# Patient Record
Sex: Female | Born: 1960 | Race: White | Hispanic: No | Smoking: Former smoker
Health system: Southern US, Community
[De-identification: ages and names within clinical notes are randomized; demographics above are authoritative.]

## PROBLEM LIST (undated history)

## (undated) DIAGNOSIS — F410 Panic disorder [episodic paroxysmal anxiety] without agoraphobia: Secondary | ICD-10-CM

## (undated) DIAGNOSIS — I639 Cerebral infarction, unspecified: Secondary | ICD-10-CM

## (undated) DIAGNOSIS — I10 Essential (primary) hypertension: Secondary | ICD-10-CM

## (undated) DIAGNOSIS — F419 Anxiety disorder, unspecified: Secondary | ICD-10-CM

## (undated) DIAGNOSIS — Z9862 Peripheral vascular angioplasty status: Secondary | ICD-10-CM

## (undated) DIAGNOSIS — J449 Chronic obstructive pulmonary disease, unspecified: Secondary | ICD-10-CM

## (undated) HISTORY — PX: TUBAL LIGATION: SHX77

## (undated) HISTORY — PX: CHOLECYSTECTOMY: SHX55

## (undated) HISTORY — PX: ABDOMINAL HYSTERECTOMY: SHX81

## (undated) HISTORY — PX: MIDDLE EAR SURGERY: SHX713

## (undated) HISTORY — PX: KNEE ARTHROSCOPY: SUR90

---

## 2010-09-02 ENCOUNTER — Emergency Department (HOSPITAL_COMMUNITY)
Admission: EM | Admit: 2010-09-02 | Discharge: 2010-09-02 | Payer: Self-pay | Source: Home / Self Care | Admitting: Emergency Medicine

## 2011-06-09 ENCOUNTER — Emergency Department (HOSPITAL_COMMUNITY): Payer: PRIVATE HEALTH INSURANCE

## 2011-06-09 ENCOUNTER — Emergency Department (HOSPITAL_COMMUNITY)
Admission: EM | Admit: 2011-06-09 | Discharge: 2011-06-09 | Disposition: A | Discharge status: Home / Self Care | Payer: PRIVATE HEALTH INSURANCE | Attending: Emergency Medicine | Admitting: Emergency Medicine

## 2011-06-09 DIAGNOSIS — S92919A Unspecified fracture of unspecified toe(s), initial encounter for closed fracture: Secondary | ICD-10-CM | POA: Insufficient documentation

## 2011-06-09 DIAGNOSIS — Y990 Civilian activity done for income or pay: Secondary | ICD-10-CM | POA: Insufficient documentation

## 2011-06-09 DIAGNOSIS — W2209XA Striking against other stationary object, initial encounter: Secondary | ICD-10-CM | POA: Insufficient documentation

## 2011-06-09 DIAGNOSIS — Y9389 Activity, other specified: Secondary | ICD-10-CM | POA: Insufficient documentation

## 2012-02-05 ENCOUNTER — Emergency Department (HOSPITAL_COMMUNITY): Payer: Medicare Other

## 2012-02-05 ENCOUNTER — Other Ambulatory Visit: Payer: Self-pay

## 2012-02-05 ENCOUNTER — Encounter (HOSPITAL_COMMUNITY): Payer: Self-pay | Admitting: *Deleted

## 2012-02-05 ENCOUNTER — Inpatient Hospital Stay (HOSPITAL_COMMUNITY)
Admission: EM | Admit: 2012-02-05 | Discharge: 2012-02-07 | DRG: 192 | Disposition: A | Discharge status: Home / Self Care | Payer: Medicare Other | Attending: Internal Medicine | Admitting: Internal Medicine

## 2012-02-05 DIAGNOSIS — Z720 Tobacco use: Secondary | ICD-10-CM

## 2012-02-05 DIAGNOSIS — Z9861 Coronary angioplasty status: Secondary | ICD-10-CM

## 2012-02-05 DIAGNOSIS — Z8673 Personal history of transient ischemic attack (TIA), and cerebral infarction without residual deficits: Secondary | ICD-10-CM

## 2012-02-05 DIAGNOSIS — I2510 Atherosclerotic heart disease of native coronary artery without angina pectoris: Secondary | ICD-10-CM | POA: Diagnosis present

## 2012-02-05 DIAGNOSIS — R071 Chest pain on breathing: Secondary | ICD-10-CM | POA: Diagnosis present

## 2012-02-05 DIAGNOSIS — M549 Dorsalgia, unspecified: Secondary | ICD-10-CM | POA: Diagnosis present

## 2012-02-05 DIAGNOSIS — Z23 Encounter for immunization: Secondary | ICD-10-CM

## 2012-02-05 DIAGNOSIS — J441 Chronic obstructive pulmonary disease with (acute) exacerbation: Secondary | ICD-10-CM

## 2012-02-05 DIAGNOSIS — Z7902 Long term (current) use of antithrombotics/antiplatelets: Secondary | ICD-10-CM

## 2012-02-05 DIAGNOSIS — Z888 Allergy status to other drugs, medicaments and biological substances status: Secondary | ICD-10-CM

## 2012-02-05 DIAGNOSIS — R079 Chest pain, unspecified: Secondary | ICD-10-CM | POA: Diagnosis present

## 2012-02-05 DIAGNOSIS — J440 Chronic obstructive pulmonary disease with acute lower respiratory infection: Principal | ICD-10-CM | POA: Diagnosis present

## 2012-02-05 DIAGNOSIS — G8929 Other chronic pain: Secondary | ICD-10-CM | POA: Diagnosis present

## 2012-02-05 DIAGNOSIS — F411 Generalized anxiety disorder: Secondary | ICD-10-CM | POA: Diagnosis present

## 2012-02-05 DIAGNOSIS — F17200 Nicotine dependence, unspecified, uncomplicated: Secondary | ICD-10-CM | POA: Diagnosis present

## 2012-02-05 DIAGNOSIS — J209 Acute bronchitis, unspecified: Principal | ICD-10-CM | POA: Diagnosis present

## 2012-02-05 DIAGNOSIS — J449 Chronic obstructive pulmonary disease, unspecified: Secondary | ICD-10-CM

## 2012-02-05 DIAGNOSIS — Z79899 Other long term (current) drug therapy: Secondary | ICD-10-CM

## 2012-02-05 HISTORY — DX: Cerebral infarction, unspecified: I63.9

## 2012-02-05 HISTORY — DX: Chronic obstructive pulmonary disease, unspecified: J44.9

## 2012-02-05 HISTORY — DX: Peripheral vascular angioplasty status: Z98.62

## 2012-02-05 LAB — BASIC METABOLIC PANEL
BUN: 22 mg/dL (ref 6–23)
CO2: 25 mEq/L (ref 19–32)
Calcium: 9.8 mg/dL (ref 8.4–10.5)
Creatinine, Ser: 0.8 mg/dL (ref 0.50–1.10)
GFR calc non Af Amer: 85 mL/min — ABNORMAL LOW (ref >90)
Glucose, Bld: 131 mg/dL — ABNORMAL HIGH (ref 70–99)

## 2012-02-05 LAB — CBC
HCT: 40.2 % (ref 36.0–46.0)
Hemoglobin: 13.4 g/dL (ref 12.0–15.0)
MCH: 29.8 pg (ref 26.0–34.0)
MCHC: 33.3 g/dL (ref 30.0–36.0)
MCV: 89.5 fL (ref 78.0–100.0)

## 2012-02-05 LAB — CARDIAC PANEL(CRET KIN+CKTOT+MB+TROPI): Relative Index: INVALID (ref 0.0–2.5)

## 2012-02-05 LAB — D-DIMER, QUANTITATIVE: D-Dimer, Quant: 0.41 ug/mL-FEU (ref 0.00–0.48)

## 2012-02-05 LAB — APTT: aPTT: 30 seconds (ref 24–37)

## 2012-02-05 LAB — PROTIME-INR: Prothrombin Time: 12.5 seconds (ref 11.6–15.2)

## 2012-02-05 MED ORDER — ASPIRIN 81 MG PO CHEW
162.0000 mg | CHEWABLE_TABLET | Freq: Once | ORAL | Status: AC
  Administered 2012-02-05: 162 mg via ORAL
  Filled 2012-02-05: qty 2

## 2012-02-05 MED ORDER — METHYLPREDNISOLONE SODIUM SUCC 125 MG IJ SOLR
125.0000 mg | Freq: Once | INTRAMUSCULAR | Status: AC
  Administered 2012-02-05: 125 mg via INTRAVENOUS
  Filled 2012-02-05: qty 2

## 2012-02-05 MED ORDER — SODIUM CHLORIDE 0.9 % IV BOLUS (SEPSIS)
500.0000 mL | Freq: Once | INTRAVENOUS | Status: AC
  Administered 2012-02-05: 23:00:00 via INTRAVENOUS

## 2012-02-05 MED ORDER — ALBUTEROL SULFATE (5 MG/ML) 0.5% IN NEBU
5.0000 mg | INHALATION_SOLUTION | Freq: Once | RESPIRATORY_TRACT | Status: AC
  Administered 2012-02-05: 5 mg via RESPIRATORY_TRACT
  Filled 2012-02-05: qty 1

## 2012-02-05 NOTE — ED Provider Notes (Signed)
History   Scribed for Wilson Singer, MD, the patient was seen in room A210/A210-01 . This chart was scribed by Lewanda Rife.   CSN: [id number]  Arrival date & time 02/05/12  2107   First MD Initiated Contact with Patient 02/05/12 2145      Chief Complaint  Patient presents with  . Chest Pain    (Consider location/radiation/quality/duration/timing/severity/associated sxs/prior treatment) HPI Comments: Pt denies calf swelling, leg pain and recent travels. Pt reports she smokes everyday and is on 4 Liters of oxygen at home. Pt also uses nebulizer tx at home as needed. Hx of stroke and cardiac catheterization. Pt complains of pleuritic chest pain.  Patient is a 51 y.o. female presenting with chest pain. The history is provided by the patient.  Chest Pain The chest pain began 1 - 2 hours ago. Chest pain occurs constantly. The chest pain is improving. At its most intense, the pain is at 10/10. The pain is currently at 7/10. The severity of the pain is severe. The quality of the pain is described as sharp. The pain radiates to the left arm. Chest pain is worsened by deep breathing. Primary symptoms include shortness of breath (worsening of shortness of breath ), cough (Non-productive), wheezing and nausea (about an hour ago ). Pertinent negatives for primary symptoms include no fatigue, no abdominal pain and no vomiting. She tried aspirin for the symptoms. Risk factors include smoking/tobacco exposure.  Her past medical history is significant for COPD.  Pertinent negatives for past medical history include no seizures.  Procedure history is positive for cardiac catheterization.     Past Medical History  Diagnosis Date  . Stroke   . H/O angioplasty   . COPD (chronic obstructive pulmonary disease)     Past Surgical History  Procedure Date  . Abdominal hysterectomy   . Cholecystectomy   . Tubal ligation   . Knee arthroscopy   . Middle ear surgery     Family History  Problem  Relation Age of Onset  . Heart attack Father   . Heart failure Father   . Hypertension Father   . Heart attack Brother     History  Substance Use Topics  . Smoking status: Current Everyday Smoker -- 0.2 packs/day  . Smokeless tobacco: Not on file  . Alcohol Use: Yes     occ    OB History    Grav Para Term Preterm Abortions TAB SAB Ect Mult Living                  Review of Systems  Constitutional: Negative for fatigue.  HENT: Negative for congestion, sinus pressure and ear discharge.   Eyes: Negative for discharge.  Respiratory: Positive for cough (Non-productive), shortness of breath (worsening of shortness of breath ) and wheezing.   Cardiovascular: Positive for chest pain.  Gastrointestinal: Positive for nausea (about an hour ago ). Negative for vomiting, abdominal pain and diarrhea.  Genitourinary: Negative for frequency and hematuria.  Musculoskeletal: Negative for back pain.  Skin: Negative for rash.  Neurological: Negative for seizures and headaches.  Hematological: Negative.   Psychiatric/Behavioral: Negative for hallucinations.    Allergies  Adhesive and Codeine  Home Medications   No current outpatient prescriptions on file.  BP 132/72  Pulse 84  Temp(Src) 97.4 F (36.3 C) (Oral)  Resp 18  Ht 5\' 4"  (1.626 m)  Wt 148 lb 5 oz (67.274 kg)  BMI 25.46 kg/m2  SpO2 98%  Physical Exam  Nursing note and  vitals reviewed. Constitutional: She is oriented to person, place, and time. She appears well-developed.  HENT:  Head: Normocephalic and atraumatic.  Mouth/Throat: Oropharynx is clear and moist. No oropharyngeal exudate.  Eyes: Conjunctivae and EOM are normal. No scleral icterus.  Neck: Normal range of motion. Neck supple. No thyromegaly present.  Cardiovascular: Normal rate and regular rhythm.  Exam reveals no gallop and no friction rub.   No murmur heard. Pulmonary/Chest: No stridor. She has wheezes (Diffuse expiratory wheezes ). She has no rales. She  exhibits no tenderness.  Abdominal: Soft. She exhibits no distension. There is no tenderness. There is no rebound.  Musculoskeletal: Normal range of motion. She exhibits no edema and no tenderness (No calf tenderness ).  Lymphadenopathy:    She has no cervical adenopathy.  Neurological: She is alert and oriented to person, place, and time. Coordination normal.  Skin: No rash noted. No erythema.  Psychiatric: She has a normal mood and affect. Her behavior is normal.    ED Course  Procedures (including critical care time)  Results for orders placed during the hospital encounter of 02/05/12  CBC      Component Value Range   WBC 6.5  4.0 - 10.5 (K/uL)   RBC 4.49  3.87 - 5.11 (MIL/uL)   Hemoglobin 13.4  12.0 - 15.0 (g/dL)   HCT 16.1  09.6 - 04.5 (%)   MCV 89.5  78.0 - 100.0 (fL)   MCH 29.8  26.0 - 34.0 (pg)   MCHC 33.3  30.0 - 36.0 (g/dL)   RDW 40.9  81.1 - 91.4 (%)   Platelets 213  150 - 400 (K/uL)  BASIC METABOLIC PANEL      Component Value Range   Sodium 139  135 - 145 (mEq/L)   Potassium 3.7  3.5 - 5.1 (mEq/L)   Chloride 103  96 - 112 (mEq/L)   CO2 25  19 - 32 (mEq/L)   Glucose, Bld 131 (*) 70 - 99 (mg/dL)   BUN 22  6 - 23 (mg/dL)   Creatinine, Ser 7.82  0.50 - 1.10 (mg/dL)   Calcium 9.8  8.4 - 95.6 (mg/dL)   GFR calc non Af Amer 85 (*) >90 (mL/min)   GFR calc Af Amer >90  >90 (mL/min)  PRO B NATRIURETIC PEPTIDE      Component Value Range   Pro B Natriuretic peptide (BNP) 103.2  0 - 125 (pg/mL)  CARDIAC PANEL(CRET KIN+CKTOT+MB+TROPI)      Component Value Range   Total CK 98  7 - 177 (U/L)   CK, MB 2.5  0.3 - 4.0 (ng/mL)   Troponin I <0.30  <0.30 (ng/mL)   Relative Index RELATIVE INDEX IS INVALID  0.0 - 2.5   D-DIMER, QUANTITATIVE      Component Value Range   D-Dimer, Quant 0.41  0.00 - 0.48 (ug/mL-FEU)  PROTIME-INR      Component Value Range   Prothrombin Time 12.5  11.6 - 15.2 (seconds)   INR 0.91  0.00 - 1.49   APTT      Component Value Range   aPTT 30  24 - 37  (seconds)  CARDIAC PANEL(CRET KIN+CKTOT+MB+TROPI)      Component Value Range   Total CK 79  7 - 177 (U/L)   CK, MB 2.3  0.3 - 4.0 (ng/mL)   Troponin I <0.30  <0.30 (ng/mL)   Relative Index RELATIVE INDEX IS INVALID  0.0 - 2.5   CARDIAC PANEL(CRET KIN+CKTOT+MB+TROPI)      Component  Value Range   Total CK 69  7 - 177 (U/L)   CK, MB 2.2  0.3 - 4.0 (ng/mL)   Troponin I <0.30  <0.30 (ng/mL)   Relative Index RELATIVE INDEX IS INVALID  0.0 - 2.5   TSH      Component Value Range   TSH 0.333 (*) 0.350 - 4.500 (uIU/mL)  COMPREHENSIVE METABOLIC PANEL      Component Value Range   Sodium 139  135 - 145 (mEq/L)   Potassium 3.7  3.5 - 5.1 (mEq/L)   Chloride 106  96 - 112 (mEq/L)   CO2 22  19 - 32 (mEq/L)   Glucose, Bld 158 (*) 70 - 99 (mg/dL)   BUN 20  6 - 23 (mg/dL)   Creatinine, Ser 1.19  0.50 - 1.10 (mg/dL)   Calcium 9.9  8.4 - 14.7 (mg/dL)   Total Protein 7.0  6.0 - 8.3 (g/dL)   Albumin 3.8  3.5 - 5.2 (g/dL)   AST 17  0 - 37 (U/L)   ALT 14  0 - 35 (U/L)   Alkaline Phosphatase 112  39 - 117 (U/L)   Total Bilirubin 0.1 (*) 0.3 - 1.2 (mg/dL)   GFR calc non Af Amer >90  >90 (mL/min)   GFR calc Af Amer >90  >90 (mL/min)  CBC      Component Value Range   WBC 8.0  4.0 - 10.5 (K/uL)   RBC 4.40  3.87 - 5.11 (MIL/uL)   Hemoglobin 13.3  12.0 - 15.0 (g/dL)   HCT 82.9  56.2 - 13.0 (%)   MCV 89.3  78.0 - 100.0 (fL)   MCH 30.2  26.0 - 34.0 (pg)   MCHC 33.8  30.0 - 36.0 (g/dL)   RDW 86.5  78.4 - 69.6 (%)   Platelets 201  150 - 400 (K/uL)  LIPID PANEL      Component Value Range   Cholesterol 156  0 - 200 (mg/dL)   Triglycerides 66  <295 (mg/dL)   HDL 61  >28 (mg/dL)   Total CHOL/HDL Ratio 2.6     VLDL 13  0 - 40 (mg/dL)   LDL Cholesterol 82  0 - 99 (mg/dL)     Chest Portable 1 View  02/05/2012  *RADIOLOGY REPORT*  Clinical Data: 51 year old female with chest pain.  PORTABLE CHEST - 1 VIEW  Comparison: None.  Findings: AP portable upright view 2130 hours.  Lung volumes within normal  limits.  Cardiac size and mediastinal contours are within normal limits.  Visualized tracheal air column is within normal limits.  No pneumothorax and allowing for portable technique, the lungs are clear.  Right upper quadrant surgical clips.  IMPRESSION: No acute cardiopulmonary abnormality.  Original Report Authenticated By: Ulla Potash III, M.D.     1. Chest pain   2. COPD exacerbation   3. COPD (chronic obstructive pulmonary disease)   4. History of stroke   5. Tobacco abuse      Date: 02/05/2012  Rate:79  Rhythm: normal sinus rhythm  QRS Axis: normal  Intervals: normal  ST/T Wave abnormalities: nonspecific T wave changes  Conduction Disutrbances:none  Narrative Interpretation:   Old EKG Reviewed: none available    MDM        I personally performed the services described in this documentation, which was scribed in my presence. The recorded information has been reviewed and considered.     Loren Racer, MD 02/06/12 [phone]

## 2012-02-05 NOTE — ED Notes (Signed)
Chest pain for 1 hour , cough today.

## 2012-02-06 ENCOUNTER — Encounter (HOSPITAL_COMMUNITY): Payer: Self-pay | Admitting: Internal Medicine

## 2012-02-06 ENCOUNTER — Other Ambulatory Visit: Payer: Self-pay

## 2012-02-06 DIAGNOSIS — Z720 Tobacco use: Secondary | ICD-10-CM | POA: Diagnosis present

## 2012-02-06 DIAGNOSIS — M549 Dorsalgia, unspecified: Secondary | ICD-10-CM | POA: Diagnosis present

## 2012-02-06 DIAGNOSIS — R072 Precordial pain: Secondary | ICD-10-CM

## 2012-02-06 DIAGNOSIS — Z8673 Personal history of transient ischemic attack (TIA), and cerebral infarction without residual deficits: Secondary | ICD-10-CM

## 2012-02-06 DIAGNOSIS — R079 Chest pain, unspecified: Secondary | ICD-10-CM | POA: Diagnosis present

## 2012-02-06 DIAGNOSIS — J449 Chronic obstructive pulmonary disease, unspecified: Secondary | ICD-10-CM

## 2012-02-06 LAB — CBC
Platelets: 201 10*3/uL (ref 150–400)
RBC: 4.4 MIL/uL (ref 3.87–5.11)
WBC: 8 10*3/uL (ref 4.0–10.5)

## 2012-02-06 LAB — CARDIAC PANEL(CRET KIN+CKTOT+MB+TROPI)
Relative Index: INVALID (ref 0.0–2.5)
Relative Index: INVALID (ref 0.0–2.5)
Total CK: 79 U/L (ref 7–177)
Total CK: 69 U/L (ref 7–177)
Total CK: 55 U/L (ref 7–177)

## 2012-02-06 LAB — COMPREHENSIVE METABOLIC PANEL
BUN: 20 mg/dL (ref 6–23)
CO2: 22 mEq/L (ref 19–32)
Chloride: 106 mEq/L (ref 96–112)
Creatinine, Ser: 0.74 mg/dL (ref 0.50–1.10)
GFR calc non Af Amer: >90 mL/min (ref >90)
Total Bilirubin: 0.1 mg/dL — ABNORMAL LOW (ref 0.3–1.2)

## 2012-02-06 LAB — LIPID PANEL
Cholesterol: 156 mg/dL (ref 0–200)
HDL: 61 mg/dL (ref >39)
Total CHOL/HDL Ratio: 2.6 RATIO

## 2012-02-06 MED ORDER — ALBUTEROL SULFATE (5 MG/ML) 0.5% IN NEBU
2.5000 mg | INHALATION_SOLUTION | RESPIRATORY_TRACT | Status: DC | PRN
  Administered 2012-02-06: 2.5 mg via RESPIRATORY_TRACT
  Filled 2012-02-06: qty 0.5

## 2012-02-06 MED ORDER — ONDANSETRON HCL 4 MG PO TABS
4.0000 mg | ORAL_TABLET | Freq: Four times a day (QID) | ORAL | Status: DC | PRN
  Administered 2012-02-06 – 2012-02-07 (×2): 4 mg via ORAL
  Filled 2012-02-06 (×2): qty 1

## 2012-02-06 MED ORDER — PNEUMOCOCCAL VAC POLYVALENT 25 MCG/0.5ML IJ INJ
0.5000 mL | INJECTION | Freq: Once | INTRAMUSCULAR | Status: AC
  Administered 2012-02-06: 0.5 mL via INTRAMUSCULAR
  Filled 2012-02-06: qty 0.5

## 2012-02-06 MED ORDER — ZOLPIDEM TARTRATE 5 MG PO TABS
10.0000 mg | ORAL_TABLET | Freq: Every evening | ORAL | Status: DC | PRN
  Administered 2012-02-06: 10 mg via ORAL
  Filled 2012-02-06: qty 2

## 2012-02-06 MED ORDER — ALBUTEROL SULFATE (5 MG/ML) 0.5% IN NEBU
2.5000 mg | INHALATION_SOLUTION | Freq: Four times a day (QID) | RESPIRATORY_TRACT | Status: DC
  Administered 2012-02-06 (×4): 2.5 mg via RESPIRATORY_TRACT
  Filled 2012-02-06 (×4): qty 0.5

## 2012-02-06 MED ORDER — ASPIRIN EC 81 MG PO TBEC
81.0000 mg | DELAYED_RELEASE_TABLET | Freq: Every day | ORAL | Status: DC
  Administered 2012-02-06 – 2012-02-07 (×2): 81 mg via ORAL
  Filled 2012-02-06 (×2): qty 1

## 2012-02-06 MED ORDER — NICOTINE 14 MG/24HR TD PT24
14.0000 mg | MEDICATED_PATCH | Freq: Every day | TRANSDERMAL | Status: DC
  Administered 2012-02-06 – 2012-02-07 (×2): 14 mg via TRANSDERMAL
  Filled 2012-02-06 (×2): qty 1

## 2012-02-06 MED ORDER — IBUPROFEN 800 MG PO TABS: 400.0000 mg | ORAL_TABLET | Freq: Three times a day (TID) | ORAL | Status: DC | PRN

## 2012-02-06 MED ORDER — IPRATROPIUM BROMIDE 0.02 % IN SOLN
0.5000 mg | Freq: Four times a day (QID) | RESPIRATORY_TRACT | Status: DC
  Administered 2012-02-06 (×4): 0.5 mg via RESPIRATORY_TRACT
  Filled 2012-02-06 (×4): qty 2.5

## 2012-02-06 MED ORDER — SODIUM CHLORIDE 0.9 % IJ SOLN
3.0000 mL | Freq: Two times a day (BID) | INTRAMUSCULAR | Status: DC
  Administered 2012-02-06 (×3): 3 mL via INTRAVENOUS
  Administered 2012-02-06: 10 mL via INTRAVENOUS
  Administered 2012-02-07: 3 mL via INTRAVENOUS
  Filled 2012-02-06 (×2): qty 3

## 2012-02-06 MED ORDER — ACETAMINOPHEN 650 MG RE SUPP: 650.0000 mg | Freq: Four times a day (QID) | RECTAL | Status: DC | PRN

## 2012-02-06 MED ORDER — ACETAMINOPHEN 325 MG PO TABS
650.0000 mg | ORAL_TABLET | Freq: Four times a day (QID) | ORAL | Status: DC | PRN
  Administered 2012-02-06 (×2): 650 mg via ORAL
  Filled 2012-02-06 (×2): qty 2

## 2012-02-06 MED ORDER — HYDROCODONE-ACETAMINOPHEN 10-325 MG PO TABS
1.0000 | ORAL_TABLET | Freq: Four times a day (QID) | ORAL | Status: DC | PRN
  Administered 2012-02-06 – 2012-02-07 (×3): 1 via ORAL
  Filled 2012-02-06 (×3): qty 1

## 2012-02-06 MED ORDER — PROMETHAZINE HCL 25 MG/ML IJ SOLN
12.5000 mg | Freq: Four times a day (QID) | INTRAMUSCULAR | Status: DC | PRN
  Administered 2012-02-06 (×2): 12.5 mg via INTRAVENOUS
  Filled 2012-02-06 (×2): qty 1

## 2012-02-06 MED ORDER — AZITHROMYCIN 250 MG PO TABS
250.0000 mg | ORAL_TABLET | Freq: Every day | ORAL | Status: DC
  Administered 2012-02-07: 250 mg via ORAL
  Filled 2012-02-06: qty 1

## 2012-02-06 MED ORDER — PNEUMOCOCCAL VAC POLYVALENT 25 MCG/0.5ML IJ INJ: 0.5000 mL | INJECTION | INTRAMUSCULAR | Status: DC

## 2012-02-06 MED ORDER — SODIUM CHLORIDE 0.9 % IJ SOLN
INTRAMUSCULAR | Status: AC
  Administered 2012-02-06: 10 mL via INTRAVENOUS
  Filled 2012-02-06: qty 3

## 2012-02-06 MED ORDER — PANTOPRAZOLE SODIUM 40 MG PO TBEC
40.0000 mg | DELAYED_RELEASE_TABLET | Freq: Every day | ORAL | Status: DC
  Administered 2012-02-06 – 2012-02-07 (×2): 40 mg via ORAL
  Filled 2012-02-06 (×2): qty 1

## 2012-02-06 MED ORDER — CLOPIDOGREL BISULFATE 75 MG PO TABS
75.0000 mg | ORAL_TABLET | Freq: Every day | ORAL | Status: DC
  Administered 2012-02-06 – 2012-02-07 (×2): 75 mg via ORAL
  Filled 2012-02-06 (×2): qty 1

## 2012-02-06 MED ORDER — PREDNISONE 20 MG PO TABS
40.0000 mg | ORAL_TABLET | Freq: Two times a day (BID) | ORAL | Status: DC
  Administered 2012-02-06 – 2012-02-07 (×4): 40 mg via ORAL
  Filled 2012-02-06 (×4): qty 2

## 2012-02-06 MED ORDER — INFLUENZA VIRUS VACC SPLIT PF IM SUSP
0.5000 mL | INTRAMUSCULAR | Status: AC
  Filled 2012-02-06: qty 0.5

## 2012-02-06 MED ORDER — ALPRAZOLAM 1 MG PO TABS
1.0000 mg | ORAL_TABLET | Freq: Four times a day (QID) | ORAL | Status: DC | PRN
  Administered 2012-02-06 – 2012-02-07 (×4): 1 mg via ORAL
  Filled 2012-02-06 (×4): qty 1

## 2012-02-06 MED ORDER — ENOXAPARIN SODIUM 40 MG/0.4ML ~~LOC~~ SOLN
40.0000 mg | Freq: Every day | SUBCUTANEOUS | Status: DC
  Administered 2012-02-06 – 2012-02-07 (×2): 40 mg via SUBCUTANEOUS
  Filled 2012-02-06 (×2): qty 0.4

## 2012-02-06 MED ORDER — SODIUM CHLORIDE 0.9 % IJ SOLN
3.0000 mL | Freq: Two times a day (BID) | INTRAMUSCULAR | Status: DC
  Administered 2012-02-06 (×2): 3 mL via INTRAVENOUS
  Filled 2012-02-06: qty 3
  Filled 2012-02-06: qty 6

## 2012-02-06 MED ORDER — ALUM & MAG HYDROXIDE-SIMETH 200-200-20 MG/5ML PO SUSP: 30.0000 mL | Freq: Four times a day (QID) | ORAL | Status: DC | PRN

## 2012-02-06 MED ORDER — AZITHROMYCIN 250 MG PO TABS
500.0000 mg | ORAL_TABLET | Freq: Every day | ORAL | Status: AC
  Administered 2012-02-06: 500 mg via ORAL
  Filled 2012-02-06: qty 2

## 2012-02-06 MED ORDER — ONDANSETRON HCL 4 MG/2ML IJ SOLN
4.0000 mg | Freq: Four times a day (QID) | INTRAMUSCULAR | Status: DC | PRN
  Administered 2012-02-07: 4 mg via INTRAVENOUS
  Filled 2012-02-06: qty 2

## 2012-02-06 NOTE — Progress Notes (Signed)
*  PRELIMINARY RESULTS* Echocardiogram 2D Echocardiogram has been performed.  Olivia Manning 02/06/2012, 2:58 PM

## 2012-02-06 NOTE — Progress Notes (Signed)
Patient admitted this morning with chest pain. She stated that her chest pain was like a heaviness on her chest and radiated down her left arm. It went away when she was in the emergency room. She also has some symptoms of acute bronchitis. Will rule her out with cardiac markers, 2-D echo ordered also consulted cardiology will start her on Z-Pak and continue steroids.  Patient examined and chart reviewed. Patient was admitted this morning.

## 2012-02-06 NOTE — H&P (Signed)
Olivia Manning is an 51 y.o. female.    PCP: in [place], [place]  Chief Complaint: Chest pain since 8:30 PM  HPI: This is a 51 year old, Caucasian female, with a past medical history of stroke, Anxiety disorder, chronic back pain, coronary artery disease, who was in her usual state of health while working when she started having sharp chest pains to the left side of her chest. It radiated to the left arm. Went under her left breast. It was 10 out of 10 in intensity. She's had a cough, which is dry for the last couple of days. Also, had some shortness of breath, but denies any diaphoresis. Had some dizziness. Had nausea, but no emesis. Felt weak and tired. She received breathing treatment in the emergency department and is feeling better. Denies any fever or chills. Denies any sick contacts. There is no precipitating, aggravating or relieving factors. Currently, pain is 7/10 in intensity.   Home Medications: Prior to Admission medications   Medication Sig Start Date End Date Taking? Authorizing Provider  ALPRAZolam Prudy Feeler) 1 MG tablet Take 1 mg by mouth 4 (four) times daily.   Yes Historical Provider, MD  clopidogrel (PLAVIX) 75 MG tablet Take 75 mg by mouth daily.   Yes Historical Provider, MD  HYDROcodone-acetaminophen (NORCO) 10-325 MG per tablet Take 1 tablet by mouth every 6 (six) hours as needed. For pain   Yes Historical Provider, MD  zolpidem (AMBIEN) 10 MG tablet Take 10 mg by mouth at bedtime as needed. For sleep   Yes Historical Provider, MD    Allergies:  Allergies  Allergen Reactions  . Adhesive (Tape)   . Codeine     Past Medical History: Past Medical History  Diagnosis Date  . Stroke   . H/O angioplasty   . COPD (chronic obstructive pulmonary disease)     Past Surgical History  Procedure Date  . Abdominal hysterectomy   . Cholecystectomy   . Tubal ligation   . Knee arthroscopy   . Middle ear surgery     Social History:  reports that she has been smoking.   She does not have any smokeless tobacco history on file. She reports that she drinks alcohol. She reports that she uses illicit drugs (Marijuana).  Family History: Patient reports a history of heart disease, diabetes, and prostate cancer in the family.  Review of Systems - History obtained from the patient General ROS: negative Psychological ROS: negative Ophthalmic ROS: negative ENT ROS: negative Allergy and Immunology ROS: negative Hematological and Lymphatic ROS: negative Endocrine ROS: negative Respiratory ROS: as in hpi Cardiovascular ROS: as in hpi Gastrointestinal ROS: negative Genito-Urinary ROS: negative Musculoskeletal ROS: positive for - pain in back - lower Neurological ROS: negative Dermatological ROS: negative  Physical Examination Blood pressure 135/103, pulse 78, temperature 98.3 F (36.8 C), temperature source Oral, resp. rate 20, height 5\' 3"  (1.6 m), weight 65.772 kg (145 lb), SpO2 100.00%.  General appearance: alert, cooperative, appears stated age and no distress Head: Normocephalic, without obvious abnormality, atraumatic Eyes: conjunctivae/corneas clear. PERRL, EOM's intact. Fundi benign. Throat: lips, mucosa, and tongue normal; teeth and gums normal Neck: no adenopathy, no carotid bruit, no JVD, supple, symmetrical, trachea midline and thyroid not enlarged, symmetric, no tenderness/mass/nodules Back: symmetric, no curvature. ROM normal. No CVA tenderness. Resp: Few wheezes bilaterally, but mostly clear. Cardio: regular rate and rhythm, S1, S2 normal, no murmur, click, rub or gallop GI: soft, non-tender; bowel sounds normal; no masses,  no organomegaly Extremities: extremities normal, atraumatic,  no cyanosis or edema Pulses: 2+ and symmetric Skin: Skin color, texture, turgor normal. No rashes or lesions Lymph nodes: Cervical, supraclavicular, and axillary nodes normal. Neurologic: Grossly normal  Laboratory Data: Results for orders placed during the  hospital encounter of 02/05/12 (from the past 48 hour(s))  CBC     Status: Normal   Collection Time   02/05/12  9:27 PM      Component Value Range Comment   WBC 6.5  4.0 - 10.5 (K/uL)    RBC 4.49  3.87 - 5.11 (MIL/uL)    Hemoglobin 13.4  12.0 - 15.0 (g/dL)    HCT 40.9  81.1 - 91.4 (%)    MCV 89.5  78.0 - 100.0 (fL)    MCH 29.8  26.0 - 34.0 (pg)    MCHC 33.3  30.0 - 36.0 (g/dL)    RDW 78.2  95.6 - 21.3 (%)    Platelets 213  150 - 400 (K/uL)   BASIC METABOLIC PANEL     Status: Abnormal   Collection Time   02/05/12  9:27 PM      Component Value Range Comment   Sodium 139  135 - 145 (mEq/L)    Potassium 3.7  3.5 - 5.1 (mEq/L)    Chloride 103  96 - 112 (mEq/L)    CO2 25  19 - 32 (mEq/L)    Glucose, Bld 131 (*) 70 - 99 (mg/dL)    BUN 22  6 - 23 (mg/dL)    Creatinine, Ser 0.86  0.50 - 1.10 (mg/dL)    Calcium 9.8  8.4 - 10.5 (mg/dL)    GFR calc non Af Amer 85 (*) >90 (mL/min)    GFR calc Af Amer >90  >90 (mL/min)   PRO B NATRIURETIC PEPTIDE     Status: Normal   Collection Time   02/05/12  9:27 PM      Component Value Range Comment   Pro B Natriuretic peptide (BNP) 103.2  0 - 125 (pg/mL)   CARDIAC PANEL(CRET KIN+CKTOT+MB+TROPI)     Status: Normal   Collection Time   02/05/12 10:19 PM      Component Value Range Comment   Total CK 98  7 - 177 (U/L)    CK, MB 2.5  0.3 - 4.0 (ng/mL)    Troponin I <0.30  <0.30 (ng/mL)    Relative Index RELATIVE INDEX IS INVALID  0.0 - 2.5    D-DIMER, QUANTITATIVE     Status: Normal   Collection Time   02/05/12 10:19 PM      Component Value Range Comment   D-Dimer, Quant 0.41  0.00 - 0.48 (ug/mL-FEU)   PROTIME-INR     Status: Normal   Collection Time   02/05/12 10:19 PM      Component Value Range Comment   Prothrombin Time 12.5  11.6 - 15.2 (seconds)    INR 0.91  0.00 - 1.49    APTT     Status: Normal   Collection Time   02/05/12 10:19 PM      Component Value Range Comment   aPTT 30  24 - 37 (seconds)     Radiology Reports: Chest Portable 1  View  02/05/2012  *RADIOLOGY REPORT*  Clinical Data: 51 year old female with chest pain.  PORTABLE CHEST - 1 VIEW  Comparison: None.  Findings: AP portable upright view 2130 hours.  Lung volumes within normal limits.  Cardiac size and mediastinal contours are within normal limits.  Visualized tracheal air column is within  normal limits.  No pneumothorax and allowing for portable technique, the lungs are clear.  Right upper quadrant surgical clips.  IMPRESSION: No acute cardiopulmonary abnormality.  Original Report Authenticated By: Harley Hallmark, M.D.    Electrocardiogram: Sinus rhythm at 79 beats per minute. Normal axis. Intervals appear to be in the normal range. No Q waves. No concerning ST changes. Nonspecific T wave, changes, especially in leads V1 and V2. No older EKGs available for comparison.  Assessment/Plan  Principal Problem:  *Chest pain Active Problems:  Tobacco abuse  History of stroke  Chronic back pain   #1 chest pain: It sounded pleuritic. She does have a history of coronary artery disease. Underwent angioplasty about 18-20 years ago. D-dimer is normal. I think the pain may be related to mild bronchitis. She'll be ruled out for acute coronary syndrome by serial cardiac enzymes. Lipid panel will be checked in the morning. EKG will be repeated. If she rules out she can get further workup as an outpatient.  #2 acute bronchitis: This may be precipitating some of her symptoms. We will give her some steroids and give her breathing treatments.  #3 tobacco abuse. Nicotine patch will be prescribed. Counseling will be provided.  #4 history of stroke. Continue with Plavix.  #5 history of chronic back pain. Stable.  DVT, prophylaxis will be initiated.  Further management decisions will depend on results of further testing and patient's response to treatment  [facility] Pager [phone]  02/06/2012, 1:00 AM

## 2012-02-06 NOTE — Consult Note (Signed)
CARDIOLOGY CONSULT NOTE  Patient ID: Olivia Manning MRN: [id number] DOB/AGE: 02-20-1961 51 y.o.  Admit date: 02/05/2012 Referring Physician: PTH Primary Physician: Family Practice in [place] Reason for Consultation: Chest Pain with hx CAD Principal Problem:  *Chest pain Active Problems:  Tobacco abuse  History of stroke  Chronic back pain  HPI: Olivia Manning is a 51 year old patient of Olivia Manning, family practice, who presented to the emergency room with severe squeezing burning chest pain, which doubled her over yesterday evening. This was associated with shortness of breath, diaphoresis, and dizziness. It did radiate to her left arm and axillary area. Has a [facility] of [facility], but has not followed up with a cardiologist since that time. Patient also has a history of embolic CVA in 2010 and was seen by Dr. Kipp Manning at [facility], where she had a carotid endarterectomy on the left. She has no residual weakness as a result of this.  She was in her usual state of health until 3 days prior to admission. At that time. She states she was at work as a Conservation officer, nature at [employer], where she had a wave of dizziness, tingling in her face and left arm and presyncope. This was associated with some diaphoresis. He passed in about 5-10 minutes. 3 days later, while still at work. She had the severe burning substernal chest pain, which he states doubled her over. She took some aspirin which helps some but because the pain was so severe. She came to the emergency room to be checked out. On arrival to the emergency room she was found to have a normal EKG. She was treated with albuterol inhaler, Solu-Medrol, and chewable aspirin. She is without complaints now of any recurrent shortness of breath or chest pain.  Review of systems complete and found to be negative unless listed above   Past Medical History  Diagnosis Date  . Stroke   . H/O  angioplasty   . COPD (chronic obstructive pulmonary disease)     History reviewed. No pertinent family history.  History   Social History  . Marital Status: Divorced    Spouse Name: N/A    Number of Children: N/A  . Years of Education: N/A   Occupational History  . Not on file.   Social History Main Topics  . Smoking status: Current Everyday Smoker -- 0.2 packs/day  . Smokeless tobacco: Not on file  . Alcohol Use: Yes     occ  . Drug Use: Yes    Special: Marijuana  . Sexually Active: Yes    Birth Control/ Protection: Surgical   Other Topics Concern  . Not on file   Social History Narrative  . No narrative on file    Past Surgical History  Procedure Date  . Abdominal hysterectomy   . Cholecystectomy   . Tubal ligation   . Knee arthroscopy   . Middle ear surgery      Prescriptions prior to admission  Medication Sig Dispense Refill  . ALPRAZolam (XANAX) 1 MG tablet Take 1 mg by mouth 4 (four) times daily.      . clopidogrel (PLAVIX) 75 MG tablet Take 75 mg by mouth daily.      Marland Kitchen HYDROcodone-acetaminophen (NORCO) 10-325 MG per tablet Take 1 tablet by mouth every 6 (six) hours as needed. For pain      . zolpidem (AMBIEN) 10 MG tablet Take 10 mg by mouth at bedtime as needed. For sleep  Physical Exam: General: Well developed, well nourished, in no acute distress, tearful Head: Eyes PERRLA, No xanthomas.   Normal cephalic and atramatic  Lungs: Expiratory wheezes with prolonged expiratory phase. Frequent coughing with deep inspiration Heart: HRRR S1 S2, 1/2 systolic murmur Pulses are 2+ & equal.            No carotid bruit. No JVD.  No abdominal bruits. No femoral bruits. Abdomen: Bowel sounds are positive, abdomen soft and non-tender without masses or                  Hernia's noted. Msk:  Back normal, normal gait. Normal strength and tone for age. Extremities: No clubbing, cyanosis or edema.  DP +1 Neuro: Alert and oriented X 3. Psych:  Tearful and anxious,  responds appropriately   Labs:   Lab Results  Component Value Date   WBC 8.0 02/06/2012   HGB 13.3 02/06/2012   HCT 39.3 02/06/2012   MCV 89.3 02/06/2012   PLT 201 02/06/2012    Lab 02/06/12 0300  NA 139  K 3.7  CL 106  CO2 22  BUN 20  CREATININE 0.74  CALCIUM 9.9  PROT 7.0  BILITOT 0.1*  ALKPHOS 112  ALT 14  AST 17  GLUCOSE 158*   Lab Results  Component Value Date   CKTOTAL 79 02/06/2012   CKMB 2.3 02/06/2012   TROPONINI <0.30 02/06/2012    Lab Results  Component Value Date   CHOL 156 02/06/2012   Lab Results  Component Value Date   HDL 61 02/06/2012   Lab Results  Component Value Date   LDLCALC 82 02/06/2012   Lab Results  Component Value Date   TRIG 66 02/06/2012   Lab Results  Component Value Date   CHOLHDL 2.6 02/06/2012   No results found for this basename: LDLDIRECT      Radiology: Chest Portable 1 View  02/05/2012  *RADIOLOGY REPORT*  Clinical Data: 51 year old female with chest pain.  PORTABLE CHEST - 1 VIEW  Comparison: None.  Findings: AP portable upright view 2130 hours.  Lung volumes within normal limits.  Cardiac size and mediastinal contours are within normal limits.  Visualized tracheal air column is within normal limits.  No pneumothorax and allowing for portable technique, the lungs are clear.  Right upper quadrant surgical clips.  IMPRESSION: No acute cardiopulmonary abnormality.  Original Report Authenticated By: Olivia Manning, M.D.   EKG:NSR without ischemic changes  ASSESSMENT AND PLAN:   1. Chest pain: History of CAD, approximately 10 years ago with angioplasty of unknown artery. Pain similar to the pain. She had prior to this procedure. EKG is normal. Cardiac enzymes are negative x3. Will plan stress Myoview in the a.m. for diagnostic prognostic purposes. Also had echocardiogram completed for LV function and valvular status. She will continue on same medications at this time as heart rate and blood pressure are well controlled.  2. COPD:  She requests pulmonary function tests to be completed during this hospitalization. He states that she knows she has lung problems. Would like to know how bad they are. We will have these ordered post stress test to be followed by pulmonologist. She does not have one in the area. That is followed by Olivia Manning for family practice  3. Ongoing tobacco abuse: Smoking cessation has been discussed. She states that she cannot use the patch as it made her very sick and has been trying to cut down on her own. She states that she had been  up to 5 packs a day, but is now down to a pack and a half a day depending upon if she is depressed or not. Smoking cessation is encouraged.   Signed: Bettey Mare. Lyman Bishop Manning [facility] 02/06/2012, 10:44 AM Co-Sign MD   Attending note:  Patient seen and examined. Reviewed limited records and database as recorded by Olivia Manning. Olivia Manning is a 51 year old woman with a reported history of CAD status post remote angioplasty in 1996 (details not clear, reportedly done at [facility]), long-standing significant tobacco abuse with COPD and use of oxygen at night time, also stroke status post carotid endarterectomy at [facility]. She does not recall the details of her prior medical history, and states that she had a "nervous breakdown" in the past and that her memory has not been good.  She is admitted to the hospital with recent episodic chest tightness associated with shortness of breath, also weakness, some diaphoresis and nausea. She works at [employer], where the symptoms have occurred. She reports chronic problems with COPD, continued tobacco use, also recent cough. No fevers or chills.  Workup so far finds normal cardiac markers, normal proBNP, chest x-ray with no acute findings, LDL 82. She is comfortable on examination, no chest pain. Afebrile with blood pressure 132/72, heart rate in the 80s. No elevated JVP or loud bruits, lungs exhibit diminished breath  sounds, no active wheezing, scattered rhonchi. Cardiac exam reveals regular rate and rhythm with S4, no rub. Extremities exhibit no pitting edema. Her ECG shows normal sinus rhythm with no acute ST segment changes.  From a cardiac perspective, plan is to followup on the echocardiogram that was already ordered, and also arrange an exercise Myoview for tomorrow for evaluation of ischemia. Further pulmonary workup is also suggested to better assess severity of her COPD, consider pulmonary function tests. We did discuss smoking cessation as well. Current medications include aspirin, Plavix, Lovenox, prednisone with nebulizer treatments, and azithromycin. We will follow with you.  Olivia Manning, M.D., Olivia Manning.

## 2012-02-06 NOTE — Progress Notes (Signed)
Patient refused influenza vaccine, but does desire pneumoccal vaccine.  Will place order for pneumoccal vaccine and administer as ordered.  Schonewitz, Candelaria Stagers 02/06/2012

## 2012-02-07 ENCOUNTER — Inpatient Hospital Stay (HOSPITAL_COMMUNITY)
Admit: 2012-02-07 | Discharge: 2012-02-07 | Disposition: A | Discharge status: Home / Self Care | Payer: Medicare Other | Attending: Adult Health | Admitting: Adult Health

## 2012-02-07 ENCOUNTER — Inpatient Hospital Stay (HOSPITAL_COMMUNITY): Payer: Medicare Other

## 2012-02-07 ENCOUNTER — Encounter (HOSPITAL_COMMUNITY): Payer: Self-pay | Admitting: Cardiology

## 2012-02-07 DIAGNOSIS — F17200 Nicotine dependence, unspecified, uncomplicated: Secondary | ICD-10-CM

## 2012-02-07 DIAGNOSIS — R079 Chest pain, unspecified: Secondary | ICD-10-CM

## 2012-02-07 DIAGNOSIS — J449 Chronic obstructive pulmonary disease, unspecified: Secondary | ICD-10-CM

## 2012-02-07 MED ORDER — PREDNISONE 20 MG PO TABS: ORAL_TABLET | ORAL | Status: DC

## 2012-02-07 MED ORDER — REGADENOSON 0.4 MG/5ML IV SOLN
INTRAVENOUS | Status: AC
  Filled 2012-02-07: qty 5

## 2012-02-07 MED ORDER — SODIUM CHLORIDE 0.9 % IJ SOLN
INTRAMUSCULAR | Status: AC
  Administered 2012-02-07: 10 mL via INTRAVENOUS
  Filled 2012-02-07: qty 10

## 2012-02-07 MED ORDER — AZITHROMYCIN 250 MG PO TABS: 250.0000 mg | ORAL_TABLET | Freq: Every day | ORAL | Status: DC

## 2012-02-07 MED ORDER — NICOTINE 14 MG/24HR TD PT24: 1.0000 | MEDICATED_PATCH | Freq: Every day | TRANSDERMAL | Status: AC

## 2012-02-07 MED ORDER — METOCLOPRAMIDE HCL 5 MG PO TABS: 5.0000 mg | ORAL_TABLET | Freq: Three times a day (TID) | ORAL | Status: DC

## 2012-02-07 MED ORDER — IPRATROPIUM BROMIDE 0.02 % IN SOLN
0.5000 mg | Freq: Four times a day (QID) | RESPIRATORY_TRACT | Status: DC
  Administered 2012-02-07 (×3): 0.5 mg via RESPIRATORY_TRACT
  Filled 2012-02-07 (×3): qty 2.5

## 2012-02-07 MED ORDER — TECHNETIUM TC 99M TETROFOSMIN IV KIT
30.0000 | PACK | Freq: Once | INTRAVENOUS | Status: AC | PRN
  Administered 2012-02-07: 30 via INTRAVENOUS

## 2012-02-07 MED ORDER — ALBUTEROL SULFATE (5 MG/ML) 0.5% IN NEBU
2.5000 mg | INHALATION_SOLUTION | Freq: Four times a day (QID) | RESPIRATORY_TRACT | Status: DC
  Administered 2012-02-07 (×3): 2.5 mg via RESPIRATORY_TRACT
  Filled 2012-02-07 (×3): qty 0.5

## 2012-02-07 MED ORDER — TECHNETIUM TC 99M TETROFOSMIN IV KIT
10.0000 | PACK | Freq: Once | INTRAVENOUS | Status: AC | PRN
  Administered 2012-02-07: 9.5 via INTRAVENOUS

## 2012-02-07 NOTE — Progress Notes (Addendum)
SUBJECTIVE:No complaints of chest pain. Breathing better  Principal Problem:  *Chest pain Active Problems:  Tobacco abuse  History of stroke  Chronic back pain  COPD (chronic obstructive pulmonary disease)   LABS: Basic Metabolic Panel:  Basename 02/06/12 0300 02/05/12 2127  NA 139 139  K 3.7 3.7  CL 106 103  CO2 22 25  GLUCOSE 158* 131*  BUN 20 22  CREATININE 0.74 0.80  CALCIUM 9.9 9.8  MG -- --  PHOS -- --   Liver Function Tests:  Basename 02/06/12 0300  AST 17  ALT 14  ALKPHOS 112  BILITOT 0.1*  PROT 7.0  ALBUMIN 3.8   CBC:  Basename 02/06/12 0300 02/05/12 2127  WBC 8.0 6.5  NEUTROABS -- --  HGB 13.3 13.4  HCT 39.3 40.2  MCV 89.3 89.5  PLT 201 213   Cardiac Enzymes:  Basename 02/06/12 1856 02/06/12 1026 02/06/12 0259  CKTOTAL 55 69 79  CKMB 2.0 2.2 2.3  CKMBINDEX -- -- --  TROPONINI <0.30 <0.30 <0.30   BNP: No components found with this basename: POCBNP:3 D-Dimer:  Basename 02/05/12 2219  DDIMER 0.41   Fasting Lipid Panel:  Basename 02/06/12 0259  CHOL 156  HDL 61  LDLCALC 82  TRIG 66  CHOLHDL 2.6  LDLDIRECT --   Thyroid Function Tests:  Basename 02/06/12 0259  TSH 0.333*  T4TOTAL --  T3FREE --  THYROIDAB --    RADIOLOGY: Chest Portable 1 View  02/05/2012  *RADIOLOGY REPORT*  Clinical Data: 51 year old female with chest pain.  PORTABLE CHEST - 1 VIEW  Comparison: None.  Findings: AP portable upright view 2130 hours.  Lung volumes within normal limits.  Cardiac size and mediastinal contours are within normal limits.  Visualized tracheal air column is within normal limits.  No pneumothorax and allowing for portable technique, the lungs are clear.  Right upper quadrant surgical clips.  IMPRESSION: No acute cardiopulmonary abnormality.  Original Report Authenticated By: Harley Hallmark, M.D.     PHYSICAL EXAM BP 114/73  Pulse 61  Temp(Src) 97.3 F (36.3 C) (Oral)  Resp 20  Ht 5\' 4"  (1.626 m)  Wt 148 lb 5 oz (67.274 kg)  BMI  25.46 kg/m2  SpO2 97% General: Well developed, well nourished, in no acute distress Head: Eyes PERRLA, No xanthomas.   Normal cephalic and atramatic  Lungs: Clear bilaterally to auscultation and percussion. Heart: HRRR S1 S2, No MRG Pulses are 2+ & equal.No carotid bruit. No JVD.  No abdominal bruits. No femoral bruits. Abdomen: Bowel sounds are positive, abdomen soft and non-tender without masses or   Hernia's noted. Msk:  Back normal, normal gait. Normal strength and tone for age. Extremities: No clubbing, cyanosis or edema.  DP +1 Neuro: Alert and oriented X 3. Psych:  Good affect, responds appropriately  TELEMETRY: Reviewed telemetry pt in NSR  ASSESSMENT AND PLAN: 1. Chest pain: He is had no further complaints of chest pain. Range status has improved. Stress test he is planned for today. If normal patient can be discharged.  2. COPD: Screening status has improved since admission. Do not hear any wheezing has decreased. PFTs were ordered but not completed yet. She'll continue on nebulizer treatments. Oxygen has been discontinued. 1 each by a pulmonologist in the area for continued management and treatment. Smoking cessation is required.    Bettey Mare. Lyman Bishop NP [facility] 02/07/2012, 9:51 AM   Attending note:  Reviewed. No further chest pain. Enzymes normal. Echocardiogram shows vigorous LV function  with grade 2 diastolic dysfunction, no major valvular abnormalities. She is scheduled for a Myoview study today for ischemic evaluation. PFTs are pending per primary team.  Jonelle Sidle, M.D., F.A.C.C.

## 2012-02-07 NOTE — Progress Notes (Signed)
Pt discharged home today per Dr. Gosrani. Pt's IV site D/C'd and WNL. Pt's VS stable at this time. Pt provided with home medication list, discharge instructions and prescriptions. Pt verbalized understanding. Pt left floor via WC in stable condition accompanied by NT. 

## 2012-02-07 NOTE — Progress Notes (Signed)
Stress Lab Nurses Notes - [name]  Olivia Manning 02/07/2012  Reason for doing test: Chest Pain and Dyspnea Type of test: Stress Myoview Nurse performing test: Parke Poisson, RN Nuclear Medicine Tech: Lou Cal Echo Tech: Not Applicable MD performing test: Ival Bible & Joni Reining NP Family MD: [facility] in [place] Test explained and consent signed: yes IV started: 20g jelco, Saline lock flushed, No redness or edema and Saline lock from floor Symptoms: significant SOB Treatment/Intervention: None Reason test stopped: SOB After recovery IV was: Discontinued in lower right hand, had some edema noted when flushed.  Patient to return to Nuc. Med at : 12:55 Patient discharged: Transported back to room 319 via wc Patient's Condition upon discharge was: stable Comments: During test peak BP 152/60 & HR 144.  Recovery BP 102/60 & HR 81.  Symptoms resolved in recovery. [name]

## 2012-02-07 NOTE — Progress Notes (Signed)
Administered xanax as requested. Vomited xanax as soon as swallowed it. Wanted something for sleep. Administered ambien which she also vomited. Gave phenergan 12.5 mg iv for N/V. Will monitor. Thinks nicotene patch is making her sick. Advised that she could remove it if side effects were intolerable. States she would crave a cigerette then. Left patch on for now.

## 2012-02-07 NOTE — Discharge Summary (Signed)
Physician Discharge Summary  Patient ID: Olivia Manning MRN: [id number] DOB/AGE: 06-09-1961 51 y.o.  Admit date: 02/05/2012 Discharge date: 02/07/2012    Discharge Diagnoses:  1. Chest pain, no evidence of myocardial ischemia. Stress test negative. 2. COPD. 3. Ongoing tobacco abuse.   Medication List  As of 02/07/2012  4:34 PM   TAKE these medications         ALPRAZolam 1 MG tablet   Commonly known as: XANAX   Take 1 mg by mouth 4 (four) times daily.      azithromycin 250 MG tablet   Commonly known as: ZITHROMAX   Take 1 tablet (250 mg total) by mouth daily.      clopidogrel 75 MG tablet   Commonly known as: PLAVIX   Take 75 mg by mouth daily.      HYDROcodone-acetaminophen 10-325 MG per tablet   Commonly known as: NORCO   Take 1 tablet by mouth every 6 (six) hours as needed. For pain      metoCLOPramide 5 MG tablet   Commonly known as: REGLAN   Take 1 tablet (5 mg total) by mouth 3 (three) times daily.      nicotine 14 mg/24hr patch   Commonly known as: NICODERM CQ - dosed in mg/24 hours   Place 1 patch onto the skin daily.      predniSONE 20 MG tablet   Commonly known as: DELTASONE   Take 2 tablets daily for 3 days, then 1 tablet daily for 3 days, then half tablet daily for 3 days, then STOP      zolpidem 10 MG tablet   Commonly known as: AMBIEN   Take 10 mg by mouth at bedtime as needed. For sleep            Discharged Condition: Stable and improved.    Consults: Cardiology, Dr. Diona Browner.  Significant Diagnostic Studies: Nm Myocar Single W/spect W/wall Motion And Ef  02/07/2012  Ordering Physician: Joni Reining  Reading Physician: Jonelle Sidle  Clinical Data: 51 year old woman with history of tobacco use and COPD, reported CAD status post remote angioplasty in 1996, stroke, referred for the assessment of ischemia with recent chest pain symptoms.  She has ruled out for myocardial infarction.  NUCLEAR MEDICINE STRESS MYOVIEW STUDY WITH SPECT AND  LEFT VENTRICULAR EJECTION FRACTION  Radionuclide Data: One-day rest/stress protocol performed with 10/30 mCi of Tc-61m Myoview.  Stress Data: The patient was exercised on a Bruce protocol for 4 minutes and 55 seconds achieving a maximum workload of seven METS. Heart rate increased from 86 beats per minute up to 146 beats per minute which was 86% of the maximum age predicted heart rate response.  Blood pressure increased from 102/52 up to 152/60.  No clearly diagnostic ST-segment changes were noted, with nondiagnostic upsloping ST-segment depression.  No arrhythmias.  EKG: Baseline ECG shows sinus rhythm at 72 beats per minute.  Scintigraphic Data: Analysis of the raw perfusion data shows breast attenuation.  Tomographic views were obtained using the short axis, vertical long axis, and horizontal long axis planes.  There is a small, moderate intensity, fixed anteroseptal defect noted.  No significant, reversible defects are seen to indicate ischemia.  Gated imaging reveals an EDV of 89, ESV of 33, T I D ratio of 0.94, and LVEF of 63% without wall motion abnormality.  IMPRESSION: Overall low risk exercise Myoview in patient with reported history of CAD.  There were no clearly diagnostic ST-segment changes, no chest pain reported.  Mildly hypertensive response and no arrhythmias.  Perfusion imaging is most consistent with breast attenuation, without definite scar or ischemia.  LVEF is 63%.  Original Report Authenticated By: [facility]   Chest Portable 1 View  02/05/2012  *RADIOLOGY REPORT*  Clinical Data: 51 year old female with chest pain.  PORTABLE CHEST - 1 VIEW  Comparison: None.  Findings: AP portable upright view 2130 hours.  Lung volumes within normal limits.  Cardiac size and mediastinal contours are within normal limits.  Visualized tracheal air column is within normal limits.  No pneumothorax and allowing for portable technique, the lungs are clear.  Right upper quadrant surgical clips.  IMPRESSION: No acute  cardiopulmonary abnormality.  Original Report Authenticated By: Harley Hallmark, M.D.    Lab Results: Basic Metabolic Panel:  Basename 02/06/12 0300 02/05/12 2127  NA 139 139  K 3.7 3.7  CL 106 103  CO2 22 25  GLUCOSE 158* 131*  BUN 20 22  CREATININE 0.74 0.80  CALCIUM 9.9 9.8  MG -- --  PHOS -- --   Liver Function Tests:  Basename 02/06/12 0300  AST 17  ALT 14  ALKPHOS 112  BILITOT 0.1*  PROT 7.0  ALBUMIN 3.8     CBC:  Basename 02/06/12 0300 02/05/12 2127  WBC 8.0 6.5  NEUTROABS -- --  HGB 13.3 13.4  HCT 39.3 40.2  MCV 89.3 89.5  PLT 201 213       Hospital Course: This 51 year old lady was admitted with symptoms of chest pain. The pain was sharp in nature and located in the left side of her chest. Apparently the pain radiated down the left arm. Serial cardiac enzymes were negative. She was seen by Dr. Diona Browner, cardiology who recommended a stress test. She underwent a stress test today and this was negative. She does have COPD and is a ongoing smoker. Chest x-ray did not show any pneumonia but she is apparently had a cough more than usual lately.  Discharge Exam: Blood pressure 107/71, pulse 66, temperature 97.4 F (36.3 C), temperature source Oral, resp. rate 20, height 5\' 4"  (1.626 m), weight 67.274 kg (148 lb 5 oz), SpO2 98.00%. She looks systemically well. She does not appear to be in any pain. Lung fields show bilateral wheezing consistent with her COPD. There are no crackles or bronchial breathing. She is alert and orientated without any focal neurologic signs. Heart sounds are present and normal without murmurs.  Disposition: Home. She will have a tapering course of prednisone and the five-day course of Zithromax. She will follow with her primary care physician.  Discharge Orders    Future Appointments: Provider: Department: Dept Phone: Center:   02/12/2012 9:00 AM [facility] Pulmonary Lab Ap-Respiratory Therapy  None     Future Orders Please Complete By  Expires   Diet - low sodium heart healthy      Increase activity slowly           SignedWilson Singer Pager [phone]  02/07/2012, 4:34 PM

## 2012-02-10 ENCOUNTER — Emergency Department (HOSPITAL_COMMUNITY)
Admission: EM | Admit: 2012-02-10 | Discharge: 2012-02-11 | Disposition: A | Discharge status: Home / Self Care | Payer: Medicare Other | Attending: Emergency Medicine | Admitting: Emergency Medicine

## 2012-02-10 ENCOUNTER — Encounter (HOSPITAL_COMMUNITY): Payer: Self-pay | Admitting: Emergency Medicine

## 2012-02-10 ENCOUNTER — Other Ambulatory Visit: Payer: Self-pay

## 2012-02-10 ENCOUNTER — Emergency Department (HOSPITAL_COMMUNITY): Payer: Medicare Other

## 2012-02-10 DIAGNOSIS — Z8673 Personal history of transient ischemic attack (TIA), and cerebral infarction without residual deficits: Secondary | ICD-10-CM | POA: Insufficient documentation

## 2012-02-10 DIAGNOSIS — F17200 Nicotine dependence, unspecified, uncomplicated: Secondary | ICD-10-CM | POA: Insufficient documentation

## 2012-02-10 DIAGNOSIS — Z7982 Long term (current) use of aspirin: Secondary | ICD-10-CM | POA: Insufficient documentation

## 2012-02-10 DIAGNOSIS — F411 Generalized anxiety disorder: Secondary | ICD-10-CM | POA: Insufficient documentation

## 2012-02-10 DIAGNOSIS — R109 Unspecified abdominal pain: Secondary | ICD-10-CM | POA: Insufficient documentation

## 2012-02-10 DIAGNOSIS — J189 Pneumonia, unspecified organism: Secondary | ICD-10-CM | POA: Insufficient documentation

## 2012-02-10 DIAGNOSIS — Z79899 Other long term (current) drug therapy: Secondary | ICD-10-CM | POA: Insufficient documentation

## 2012-02-10 DIAGNOSIS — J449 Chronic obstructive pulmonary disease, unspecified: Secondary | ICD-10-CM | POA: Insufficient documentation

## 2012-02-10 DIAGNOSIS — R059 Cough, unspecified: Secondary | ICD-10-CM | POA: Insufficient documentation

## 2012-02-10 DIAGNOSIS — R110 Nausea: Secondary | ICD-10-CM | POA: Insufficient documentation

## 2012-02-10 DIAGNOSIS — R05 Cough: Secondary | ICD-10-CM | POA: Insufficient documentation

## 2012-02-10 DIAGNOSIS — R51 Headache: Secondary | ICD-10-CM | POA: Insufficient documentation

## 2012-02-10 DIAGNOSIS — R0602 Shortness of breath: Secondary | ICD-10-CM | POA: Insufficient documentation

## 2012-02-10 DIAGNOSIS — J4489 Other specified chronic obstructive pulmonary disease: Secondary | ICD-10-CM | POA: Insufficient documentation

## 2012-02-10 DIAGNOSIS — R112 Nausea with vomiting, unspecified: Secondary | ICD-10-CM | POA: Insufficient documentation

## 2012-02-10 LAB — BASIC METABOLIC PANEL
BUN: 27 mg/dL — ABNORMAL HIGH (ref 6–23)
Chloride: 99 mEq/L (ref 96–112)
GFR calc Af Amer: 89 mL/min — ABNORMAL LOW (ref >90)
Glucose, Bld: 102 mg/dL — ABNORMAL HIGH (ref 70–99)
Potassium: 3.9 mEq/L (ref 3.5–5.1)

## 2012-02-10 LAB — DIFFERENTIAL
Lymphocytes Relative: 18 % (ref 12–46)
Lymphs Abs: 2.4 10*3/uL (ref 0.7–4.0)
Monocytes Relative: 6 % (ref 3–12)
Neutro Abs: 9.8 10*3/uL — ABNORMAL HIGH (ref 1.7–7.7)
Neutrophils Relative %: 75 % (ref 43–77)

## 2012-02-10 LAB — CBC
Hemoglobin: 14.4 g/dL (ref 12.0–15.0)
RBC: 4.79 MIL/uL (ref 3.87–5.11)

## 2012-02-10 MED ORDER — IPRATROPIUM BROMIDE 0.02 % IN SOLN
0.5000 mg | Freq: Once | RESPIRATORY_TRACT | Status: AC
  Administered 2012-02-10: 0.5 mg via RESPIRATORY_TRACT
  Filled 2012-02-10: qty 2.5

## 2012-02-10 MED ORDER — HYDROMORPHONE HCL PF 2 MG/ML IJ SOLN
2.0000 mg | Freq: Once | INTRAMUSCULAR | Status: AC
  Administered 2012-02-11: 2 mg via INTRAMUSCULAR
  Filled 2012-02-10: qty 1

## 2012-02-10 MED ORDER — PREDNISONE 20 MG PO TABS
60.0000 mg | ORAL_TABLET | Freq: Once | ORAL | Status: AC
  Administered 2012-02-10: 60 mg via ORAL
  Filled 2012-02-10: qty 3

## 2012-02-10 MED ORDER — ONDANSETRON HCL 4 MG/2ML IJ SOLN
4.0000 mg | Freq: Once | INTRAMUSCULAR | Status: DC
  Filled 2012-02-10: qty 2

## 2012-02-10 MED ORDER — ALBUTEROL SULFATE (5 MG/ML) 0.5% IN NEBU
5.0000 mg | INHALATION_SOLUTION | Freq: Once | RESPIRATORY_TRACT | Status: AC
  Administered 2012-02-10: 5 mg via RESPIRATORY_TRACT
  Filled 2012-02-10: qty 1

## 2012-02-10 MED ORDER — PROMETHAZINE HCL 25 MG/ML IJ SOLN
25.0000 mg | Freq: Once | INTRAMUSCULAR | Status: AC
  Administered 2012-02-11: 25 mg via INTRAMUSCULAR
  Filled 2012-02-10: qty 1

## 2012-02-10 MED ORDER — ONDANSETRON 8 MG PO TBDP
8.0000 mg | ORAL_TABLET | Freq: Once | ORAL | Status: AC
  Administered 2012-02-10: 8 mg via ORAL
  Filled 2012-02-10: qty 1

## 2012-02-10 MED ORDER — DEXAMETHASONE SODIUM PHOSPHATE 10 MG/ML IJ SOLN
10.0000 mg | Freq: Once | INTRAMUSCULAR | Status: AC
  Administered 2012-02-11: 10 mg via INTRAMUSCULAR
  Filled 2012-02-10: qty 1

## 2012-02-10 MED ORDER — PROMETHAZINE HCL 25 MG/ML IJ SOLN: 25.0000 mg | Freq: Once | INTRAMUSCULAR | Status: DC

## 2012-02-10 MED ORDER — ALBUTEROL SULFATE (5 MG/ML) 0.5% IN NEBU
10.0000 mg | INHALATION_SOLUTION | Freq: Once | RESPIRATORY_TRACT | Status: AC
  Administered 2012-02-10: 10 mg via RESPIRATORY_TRACT
  Filled 2012-02-10: qty 0.5
  Filled 2012-02-10: qty 1.5

## 2012-02-10 NOTE — ED Notes (Signed)
Attempted IV access x 2 without success. Charge nurse and MD aware.

## 2012-02-10 NOTE — ED Notes (Signed)
Patient complaining of head pain. Would like pain medication.

## 2012-02-10 NOTE — ED Provider Notes (Signed)
This chart was scribed for No att. providers found by Williemae Natter. The patient was seen in room APA05/[facility] at 10:19 PM.  CSN: [id number]  Arrival date & time 02/10/12  2103   First MD Initiated Contact with Patient 02/10/12 2212      Chief Complaint  Patient presents with  . Shortness of Breath  . Nausea  . Headache  . Cough    (Consider location/radiation/quality/duration/timing/severity/associated sxs/prior treatment) Patient is a 51 y.o. female presenting with shortness of breath. The history is provided by the patient.  Shortness of Breath  The current episode started today. The problem occurs frequently. The problem has been gradually worsening. The problem is mild. The symptoms are relieved by nothing. The symptoms are aggravated by nothing. Associated symptoms include cough and shortness of breath. She has had prior hospitalizations.   Olivia Manning is a 51 y.o. female who presents to the Emergency Department complaining of acute onset shortness of breath since 3 pm this afternoon. Pt uses a breathing treatment at home. She has had 3 breathing treatments today. Pt worked a shift today and has associated cough, nausea, and abdominal pain. Pt is eating and drinking okay. Pt denies any chest pain or dysuria. Pt was recently seen in ED and admitted to [facility]. Discharged 2 days ago.  Past Medical History  Diagnosis Date  . Stroke   . H/O angioplasty   . COPD (chronic obstructive pulmonary disease)     Past Surgical History  Procedure Date  . Abdominal hysterectomy   . Cholecystectomy   . Tubal ligation   . Knee arthroscopy   . Middle ear surgery     Family History  Problem Relation Age of Onset  . Heart attack Father   . Heart failure Father   . Hypertension Father   . Heart attack Brother     History  Substance Use Topics  . Smoking status: Current Everyday Smoker -- 0.2 packs/day  . Smokeless tobacco: Not on file  . Alcohol Use: Yes     occ    OB  History    Grav Para Term Preterm Abortions TAB SAB Ect Mult Living                  Review of Systems  Respiratory: Positive for cough and shortness of breath.    10 Systems reviewed and are negative for acute change except as noted in the HPI.  Allergies  Codeine and Adhesive  Home Medications   Current Outpatient Rx  Name Route Sig Dispense Refill  . ALPRAZOLAM 1 MG PO TABS Oral Take 1 mg by mouth 4 (four) times daily.    . ASPIRIN EC 81 MG PO TBEC Oral Take 81 mg by mouth daily.    . BC HEADACHE POWDER PO Oral Take 1 packet by mouth daily as needed. Pain    . AZITHROMYCIN 250 MG PO TABS Oral Take 1 tablet (250 mg total) by mouth daily. 5 tablet 0  . CLOPIDOGREL BISULFATE 75 MG PO TABS Oral Take 75 mg by mouth daily.    Marland Kitchen DOXYCYCLINE HYCLATE 100 MG PO CAPS Oral Take 1 capsule (100 mg total) by mouth 2 (two) times daily. 20 capsule 0  . HYDROCODONE-ACETAMINOPHEN 10-325 MG PO TABS Oral Take 1 tablet by mouth every 6 (six) hours as needed. For pain    . METOCLOPRAMIDE HCL 5 MG PO TABS Oral Take 1 tablet (5 mg total) by mouth 3 (three) times daily. 30 tablet 0  .  NICOTINE 14 MG/24HR TD PT24 Transdermal Place 1 patch onto the skin daily. 28 patch 0  . PREDNISONE 20 MG PO TABS  Take 2 tablets daily for 3 days, then 1 tablet daily for 3 days, then half tablet daily for 3 days, then STOP    . PROCHLORPERAZINE MALEATE 10 MG PO TABS  Take one, three times a day as needed for nausea, or vomiting 20 tablet 0  . ZOLPIDEM TARTRATE 10 MG PO TABS Oral Take 10 mg by mouth at bedtime as needed. For sleep      BP 136/82  Pulse 60  Temp(Src) 97.5 F (36.4 C) (Oral)  Resp 22  Ht 5\' 4"  (1.626 m)  Wt 142 lb (64.411 kg)  BMI 24.37 kg/m2  SpO2 97%  Physical Exam  Nursing note and vitals reviewed. Constitutional: She is oriented to person, place, and time. She appears well-developed and well-nourished.       Pt is anxious and tearful  HENT:  Head: Normocephalic and atraumatic.  Eyes:  Conjunctivae are normal. Pupils are equal, round, and reactive to light.  Neck: Normal range of motion. Neck supple.  Cardiovascular: Normal rate and normal heart sounds.   Pulmonary/Chest: Effort normal and breath sounds normal. She has no rales.       Few scattered rhonchi  Neurological: She is alert and oriented to person, place, and time.  Skin: Skin is warm and dry.  Psychiatric: Her mood appears anxious.    ED Course  Procedures (including critical care time) DIAGNOSTIC STUDIES: Oxygen Saturation is 100% on room air, normal by my interpretation.    COORDINATION OF CARE:  Medications  prochlorperazine (COMPAZINE) 10 MG tablet (not administered)  doxycycline (VIBRAMYCIN) 100 MG capsule (not administered)  albuterol (PROVENTIL) (5 MG/ML) 0.5% nebulizer solution 5 mg (5 mg Nebulization Given 02/10/12 2139)  ipratropium (ATROVENT) nebulizer solution 0.5 mg (0.5 mg Nebulization Given 02/10/12 2139)  predniSONE (DELTASONE) tablet 60 mg (60 mg Oral Given 02/10/12 2330)  albuterol (PROVENTIL) (5 MG/ML) 0.5% nebulizer solution 10 mg (10 mg Nebulization Given 02/10/12 2322)  ipratropium (ATROVENT) nebulizer solution 0.5 mg (0.5 mg Nebulization Given 02/10/12 2323)  ondansetron (ZOFRAN-ODT) disintegrating tablet 8 mg (8 mg Oral Given 02/10/12 2234)  HYDROmorphone (DILAUDID) injection 2 mg (2 mg Intramuscular Given 02/11/12 0000)  dexamethasone (DECADRON) injection 10 mg (10 mg Intramuscular Given 02/11/12 0001)  promethazine (PHENERGAN) injection 25 mg (25 mg Intramuscular Given 02/11/12 0000)  doxycycline (VIBRA-TABS) tablet 100 mg (100 mg Oral Given 02/11/12 0136)    Date: 02/11/2012  Rate: 67  Rhythm: indeterminate  QRS Axis: normal  Intervals: normal  ST/T Wave abnormalities: normal  Conduction Disutrbances:none  Narrative Interpretation:   Old EKG Reviewed: unchanged  01:05- patient is comfortable after treatment with IM medications following an episode of vomiting here. Blood pressure  has normalized. O2 saturation is normal. Additional metastatic ordered Compazine, and doxycycline orally.  Labs Reviewed  CBC - Abnormal; Notable for the following:    WBC 13.1 (*)    All other components within normal limits  DIFFERENTIAL - Abnormal; Notable for the following:    Neutro Abs 9.8 (*)    All other components within normal limits  BASIC METABOLIC PANEL - Abnormal; Notable for the following:    Glucose, Bld 102 (*)    BUN 27 (*)    GFR calc non Af Amer 76 (*)    GFR calc Af Amer 89 (*)    All other components within normal limits  URINALYSIS, ROUTINE  W REFLEX MICROSCOPIC - Abnormal; Notable for the following:    Specific Gravity, Urine <1.005 (*)    All other components within normal limits  URINE RAPID DRUG SCREEN (HOSP PERFORMED) - Abnormal; Notable for the following:    Tetrahydrocannabinol POSITIVE (*)    All other components within normal limits  LAB REPORT - SCANNED   Ct Head Wo Contrast  02/11/2012  *RADIOLOGY REPORT*  Clinical Data: Headache, seizure, history of stroke  CT HEAD WITHOUT CONTRAST  Technique:  Contiguous axial images were obtained from the base of the skull through the vertex without contrast.  Comparison: None.  Findings: Motion degraded images.  No evidence of parenchymal hemorrhage or extra-axial fluid collection. No mass lesion, mass effect, or midline shift.  No CT evidence of acute infarction.  Cerebral volume is age appropriate.  No ventriculomegaly.  The visualized paranasal sinuses are essentially clear. The mastoid air cells are unopacified.  No evidence of calvarial fracture.  IMPRESSION: Motion degraded images.  No evidence of acute intracranial abnormality.  Original Report Authenticated By: Charline Bills, M.D.   Dg Chest Port 1 View  02/10/2012  *RADIOLOGY REPORT*  Clinical Data: Shortness of breath and cough.  PORTABLE CHEST - 1 VIEW  Comparison: Chest radiograph performed 02/05/2012  Findings: The lungs are well-aerated.  Minimal left  basilar airspace opacity raises question for mild pneumonia.  There is no evidence of pleural effusion or pneumothorax.  The cardiomediastinal silhouette is within normal limits.  No acute osseous abnormalities are seen.  IMPRESSION: Minimal left basilar airspace opacity raises question for mild pneumonia.  Original Report Authenticated By: Tonia Ghent, M.D.     1. Pneumonia   2. COPD (chronic obstructive pulmonary disease)   3. Nausea and vomiting    Pt was Treated in ED with analgesic, respiratory and antiemetic medications with improvement; labs and imaging reviewed and considered in diagnostic decision making.   MDM  Shortness of breath, coughing, vomiting, related to COPD with possible early pneumonia, resistant to Zithromax that she is currently taking. Patient is nontoxic and doubt sepsis. I think the addition of a second antibiotic may be beneficial   Plan: Prescription for doxycycline and Compazine to use  in addition to her current treatments. Will release from work for 3 days.  I personally performed the services described in this documentation, which was scribed in my presence. The recorded information has been reviewed and considered.         Flint Melter, MD 02/11/12 [phone]

## 2012-02-10 NOTE — ED Notes (Signed)
Patient states she has been coughing, n & v, shortness of breath, and has had a headache today. States it started around 3 this afternoon.

## 2012-02-11 ENCOUNTER — Other Ambulatory Visit: Payer: Self-pay

## 2012-02-11 ENCOUNTER — Emergency Department (HOSPITAL_COMMUNITY)
Admission: EM | Admit: 2012-02-11 | Discharge: 2012-02-11 | Disposition: A | Discharge status: Home Health | Payer: Medicare Other | Attending: Emergency Medicine | Admitting: Emergency Medicine

## 2012-02-11 ENCOUNTER — Encounter (HOSPITAL_COMMUNITY): Payer: Self-pay | Admitting: *Deleted

## 2012-02-11 ENCOUNTER — Emergency Department (HOSPITAL_COMMUNITY): Payer: Medicare Other

## 2012-02-11 DIAGNOSIS — J4489 Other specified chronic obstructive pulmonary disease: Secondary | ICD-10-CM | POA: Insufficient documentation

## 2012-02-11 DIAGNOSIS — Z79899 Other long term (current) drug therapy: Secondary | ICD-10-CM | POA: Insufficient documentation

## 2012-02-11 DIAGNOSIS — R51 Headache: Secondary | ICD-10-CM | POA: Insufficient documentation

## 2012-02-11 DIAGNOSIS — R4182 Altered mental status, unspecified: Secondary | ICD-10-CM | POA: Insufficient documentation

## 2012-02-11 DIAGNOSIS — R05 Cough: Secondary | ICD-10-CM | POA: Insufficient documentation

## 2012-02-11 DIAGNOSIS — R7989 Other specified abnormal findings of blood chemistry: Secondary | ICD-10-CM | POA: Insufficient documentation

## 2012-02-11 DIAGNOSIS — F17200 Nicotine dependence, unspecified, uncomplicated: Secondary | ICD-10-CM | POA: Insufficient documentation

## 2012-02-11 DIAGNOSIS — R059 Cough, unspecified: Secondary | ICD-10-CM | POA: Insufficient documentation

## 2012-02-11 DIAGNOSIS — R569 Unspecified convulsions: Secondary | ICD-10-CM

## 2012-02-11 DIAGNOSIS — R112 Nausea with vomiting, unspecified: Secondary | ICD-10-CM | POA: Insufficient documentation

## 2012-02-11 DIAGNOSIS — IMO0002 Reserved for concepts with insufficient information to code with codable children: Secondary | ICD-10-CM | POA: Insufficient documentation

## 2012-02-11 DIAGNOSIS — Z8673 Personal history of transient ischemic attack (TIA), and cerebral infarction without residual deficits: Secondary | ICD-10-CM | POA: Insufficient documentation

## 2012-02-11 DIAGNOSIS — J449 Chronic obstructive pulmonary disease, unspecified: Secondary | ICD-10-CM | POA: Insufficient documentation

## 2012-02-11 DIAGNOSIS — Z7982 Long term (current) use of aspirin: Secondary | ICD-10-CM | POA: Insufficient documentation

## 2012-02-11 LAB — COMPREHENSIVE METABOLIC PANEL
AST: 71 U/L — ABNORMAL HIGH (ref 0–37)
BUN: 28 mg/dL — ABNORMAL HIGH (ref 6–23)
CO2: 26 mEq/L (ref 19–32)
Chloride: 94 mEq/L — ABNORMAL LOW (ref 96–112)
Creatinine, Ser: 0.83 mg/dL (ref 0.50–1.10)
GFR calc non Af Amer: 81 mL/min — ABNORMAL LOW (ref >90)
Total Bilirubin: 0.4 mg/dL (ref 0.3–1.2)

## 2012-02-11 LAB — RAPID URINE DRUG SCREEN, HOSP PERFORMED
Barbiturates: NOT DETECTED
Benzodiazepines: NOT DETECTED
Cocaine: NOT DETECTED

## 2012-02-11 LAB — URINALYSIS, ROUTINE W REFLEX MICROSCOPIC
Bilirubin Urine: NEGATIVE
Hgb urine dipstick: NEGATIVE
Nitrite: NEGATIVE
Specific Gravity, Urine: <1.005 — ABNORMAL LOW (ref 1.005–1.030)
pH: 7.5 (ref 5.0–8.0)

## 2012-02-11 LAB — CBC
HCT: 44.9 % (ref 36.0–46.0)
Hemoglobin: 15.2 g/dL — ABNORMAL HIGH (ref 12.0–15.0)
WBC: 11.5 10*3/uL — ABNORMAL HIGH (ref 4.0–10.5)

## 2012-02-11 LAB — DIFFERENTIAL
Lymphocytes Relative: 14 % (ref 12–46)
Monocytes Absolute: 0.6 10*3/uL (ref 0.1–1.0)
Monocytes Relative: 5 % (ref 3–12)
Neutro Abs: 9.3 10*3/uL — ABNORMAL HIGH (ref 1.7–7.7)

## 2012-02-11 LAB — GLUCOSE, CAPILLARY: Glucose-Capillary: 162 mg/dL — ABNORMAL HIGH (ref 70–99)

## 2012-02-11 MED ORDER — LORAZEPAM 2 MG/ML IJ SOLN
INTRAMUSCULAR | Status: AC
  Administered 2012-02-11: 2 mg via INTRAVENOUS
  Filled 2012-02-11: qty 1

## 2012-02-11 MED ORDER — SODIUM CHLORIDE 0.9 % IV SOLN
INTRAVENOUS | Status: DC
  Administered 2012-02-11: 14:00:00 via INTRAVENOUS

## 2012-02-11 MED ORDER — DIPHENHYDRAMINE HCL 50 MG/ML IJ SOLN
25.0000 mg | Freq: Once | INTRAMUSCULAR | Status: AC
  Administered 2012-02-11: 25 mg via INTRAVENOUS
  Filled 2012-02-11: qty 1

## 2012-02-11 MED ORDER — HYDROMORPHONE HCL PF 1 MG/ML IJ SOLN
1.0000 mg | Freq: Once | INTRAMUSCULAR | Status: AC
  Administered 2012-02-11: 1 mg via INTRAVENOUS
  Filled 2012-02-11: qty 1

## 2012-02-11 MED ORDER — METOCLOPRAMIDE HCL 5 MG/ML IJ SOLN
10.0000 mg | Freq: Once | INTRAMUSCULAR | Status: AC
  Administered 2012-02-11: 10 mg via INTRAVENOUS
  Filled 2012-02-11: qty 2

## 2012-02-11 MED ORDER — DOXYCYCLINE HYCLATE 100 MG PO CAPS: 100.0000 mg | ORAL_CAPSULE | Freq: Two times a day (BID) | ORAL | Status: AC

## 2012-02-11 MED ORDER — DOXYCYCLINE HYCLATE 100 MG PO TABS
100.0000 mg | ORAL_TABLET | Freq: Once | ORAL | Status: AC
  Administered 2012-02-11: 100 mg via ORAL
  Filled 2012-02-11: qty 1

## 2012-02-11 MED ORDER — LORAZEPAM 2 MG/ML IJ SOLN
INTRAMUSCULAR | Status: AC
  Filled 2012-02-11: qty 1

## 2012-02-11 MED ORDER — PROCHLORPERAZINE MALEATE 10 MG PO TABS
ORAL_TABLET | ORAL | Status: AC
  Filled 2012-02-11: qty 1

## 2012-02-11 MED ORDER — LORAZEPAM 2 MG/ML IJ SOLN
2.0000 mg | Freq: Once | INTRAMUSCULAR | Status: AC
  Administered 2012-02-11: 2 mg via INTRAVENOUS

## 2012-02-11 MED ORDER — ONDANSETRON HCL 4 MG/2ML IJ SOLN
4.0000 mg | Freq: Once | INTRAMUSCULAR | Status: AC
  Administered 2012-02-11: 4 mg via INTRAVENOUS
  Filled 2012-02-11: qty 2

## 2012-02-11 MED ORDER — PROCHLORPERAZINE MALEATE 10 MG PO TABS
10.0000 mg | ORAL_TABLET | Freq: Four times a day (QID) | ORAL | Status: DC | PRN
  Administered 2012-02-11: 10 mg via ORAL
  Filled 2012-02-11: qty 1

## 2012-02-11 MED ORDER — PROCHLORPERAZINE MALEATE 10 MG PO TABS: ORAL_TABLET | ORAL | Status: DC

## 2012-02-11 NOTE — ED Notes (Signed)
Pt requesting pain med for h/a. edp aware

## 2012-02-11 NOTE — Discharge Instructions (Signed)
You are having a headache. No specific cause was found today for your headache. It may have been a migraine or other cause of headache. Stress, anxiety, fatigue, and depression are common triggers for headaches. Your headache today does not appear to be life-threatening or require hospitalization, but often the exact cause of headaches is not determined in the emergency department. Therefore, follow-up with your doctor is very important to find out what may have caused your headache, and whether or not you need any further diagnostic testing or treatment. Sometimes headaches can appear benign (not harmful), but then more serious symptoms can develop which should prompt an immediate re-evaluation by your doctor or the emergency department. SEEK MEDICAL ATTENTION IF: You develop possible problems with medications prescribed.  The medications don't resolve your headache, if it recurs , or if you have multiple episodes of vomiting or can't take fluids. You have a change from the usual headache. RETURN IMMEDIATELY IF you develop a sudden, severe headache or confusion, become poorly responsive or faint, develop a fever above 100.78F or problem breathing, have a change in speech, vision, swallowing, or understanding, or develop new weakness, numbness, tingling, incoordination, or have a seizure.  Stop the prednisone, Reglan, Compazine, Zithromax, and do not start the doxycycline.  Try OTC ibuprofen for your headache as directed.  Have your doctor review lab results from this visit and a repeat liver tests as an outpatient since they were mildly elevated this visit.  Do not take Tylenol until your liver tests are rechecked and back to normal.

## 2012-02-11 NOTE — ED Notes (Signed)
Pt DC to home with daughter and steady gait.

## 2012-02-11 NOTE — ED Notes (Signed)
C/o HA since yesterday, recently started new meds, patient yelling out at times and hyperventilated at times

## 2012-02-11 NOTE — Discharge Instructions (Signed)
Clear Liquid Diet The clear liquid dietconsists of foods that are liquid or will become liquid at room temperature.You should be able to see through the liquid and beverages. Examples of foods allowed on a clear liquid diet include fruit juice, broth or bouillon, gelatin, or frozen ice pops. The purpose of this diet is to provide necessary fluid, electrolytes such a sodium and potassium, and energy to keep the body functioning during times when you are not able to consume a regular diet.A clear liquid diet should not be continued for long periods of time as it is not nutritionally adequate.  REASONS FOR USING A CLEAR LIQUID DIET  In sudden onset (acute) conditions for a patient before or after surgery.   As the first step in oral feeding.   For fluid and electrolyte replacement in diarrheal diseases.   As a diet before certain medical tests are performed.  ADEQUACY The clear liquid diet is adequate only in ascorbic acid, according to the Recommended Dietary Allowances of the [employer]. CHOOSING FOODS Breads and Starches  Allowed:  None are allowed.   Avoid: All are avoided.  Vegetables  Allowed:  Strained tomato or vegetable juice.   Avoid: Any others.  Fruit  Allowed:  Strained fruit juices and fruit drinks. Include 1 serving of citrus or vitamin C-enriched fruit juice daily.   Avoid: Any others.  Meat and Meat Substitutes  Allowed:  None are allowed.   Avoid: All are avoided.  Milk  Allowed:  None are allowed.   Avoid: All are avoided.  Soups and Combination Foods  Allowed:  Clear bouillon, broth, or strained broth-based soups.   Avoid: Any others.  Desserts and Sweets  Allowed:  Sugar, honey. High protein gelatin. Flavored gelatin, ices, or frozen ice pops that do not contain milk.   Avoid: Any others.  Fats and Oils  Allowed:  None are allowed.   Avoid: All are avoided.  Beverages  Allowed:  Carbonated beverages, cereal beverages, coffee  (regular or decaffeinated), or tea.   Avoid: Any others.  Condiments  Allowed:  Iodized salt.   Avoid: Any others, including pepper.  Supplements  Allowed:  Liquid nutrition beverages.   Avoid: Any others that contain lactose or fiber.  SAMPLE MEAL PLAN Breakfast  4 oz strained orange juice.    to 1 cup gelatin (plain or fortified).   1 cup beverage (coffee or tea).   Sugar, if desired.  Midmorning Snack   cup gelatin (plain or fortified).  Lunch  1 cup broth or consomm.   4 oz strained grapefruit juice.    cup gelatin (plain or fortified).   1 cup beverage (coffee or tea).   Sugar, if desired.  Midafternoon Snack   cup fruit ice.    cup strained fruit juice.  Dinner  1 cup broth or consomm.    cup cranberry juice.    cup flavored gelatin (plain or fortified).   1 cup beverage (coffee or tea).   Sugar, if desired.  Evening Snack  4 oz strained apple juice (vitamin C-fortified).    cup flavored gelatin (plain or fortified).  Document Released: 12/03/2005 Document Revised: 08/15/2011 Document Reviewed: 03/02/2011 [facility] Patient Information 2012 [place], [place].B.R.A.T. Diet Your doctor has recommended the B.R.A.T. diet for you or your child until the condition improves. This is often used to help control diarrhea and vomiting symptoms. If you or your child can tolerate clear liquids, you may have:  Bananas.   Rice.   Applesauce.  Toast (and other simple starches such as crackers, potatoes, noodles).  Be sure to avoid dairy products, meats, and fatty foods until symptoms are better. Fruit juices such as apple, grape, and prune juice can make diarrhea worse. Avoid these. Continue this diet for 2 days or as instructed by your caregiver. Document Released: 12/03/2005 Document Revised: 08/15/2011 Document Reviewed: 05/22/2007 [facility] Patient Information 2012 [place], [place].Chronic Obstructive Pulmonary Disease Chronic obstructive  pulmonary disease (COPD) is a condition in which airflow from the lungs is restricted. The lungs can never return to normal, but there are measures you can take which will improve them and make you feel better. CAUSES   Smoking.   Exposure to secondhand smoke.   Breathing in irritants (pollution, cigarette smoke, strong smells, aerosol sprays, paint fumes).   History of lung infections.  TREATMENT  Treatment focuses on making you comfortable (supportive care). Your caregiver may prescribe medications (inhaled or pills) to help improve your breathing. HOME CARE INSTRUCTIONS   If you smoke, stop smoking.   Avoid exposure to smoke, chemicals, and fumes that aggravate your breathing.   Take antibiotic medicines as directed by your caregiver.   Avoid medicines that dry up your system and slow down the elimination of secretions (antihistamines and cough syrups). This decreases respiratory capacity and may lead to infections.   Drink enough water and fluids to keep your urine clear or pale yellow. This loosens secretions.   Use humidifiers at home and at your bedside if they do not make breathing difficult.   Receive all protective vaccines your caregiver suggests, especially pneumococcal and influenza.   Use home oxygen as suggested.   Stay active. Exercise and physical activity will help maintain your ability to do things you want to do.   Eat a healthy diet.  SEEK MEDICAL CARE IF:   You develop pus-like mucus (sputum).   Breathing is more labored or exercise becomes difficult to do.   You are running out of the medicine you take for your breathing.  SEEK IMMEDIATE MEDICAL CARE IF:   You have a rapid heart rate.   You have agitation, confusion, tremors, or are in a stupor (family members may need to observe this).   It becomes difficult to breathe.   You develop chest pain.   You have a fever.  MAKE SURE YOU:   Understand these instructions.   Will watch your  condition.   Will get help right away if you are not doing well or get worse.  Document Released: 09/12/2005 Document Revised: 08/15/2011 Document Reviewed: 02/02/2011 [facility] Patient Information 2012 [place], [place].Pneumonia, Adult Pneumonia is an infection of the lungs. It may be caused by a germ (virus or bacteria). Some types of pneumonia can spread easily from person to person. This can happen when you cough or sneeze. HOME CARE  Only take medicine as told by your doctor.   Take your medicine (antibiotics) as told. Finish it even if you start to feel better.   Do not smoke.   You may use a vaporizer or humidifier in your room. This can help loosen thick spit (mucus).   Sleep so you are almost sitting up (semi-upright). This helps reduce coughing.   Rest.  A shot (vaccine) can help prevent pneumonia. Shots are often advised for:  People over 75 years old.   Patients on chemotherapy.   People with long-term (chronic) lung problems.   People with immune system problems.  GET HELP RIGHT AWAY IF:   You are  getting worse.   You cannot control your cough, and you are losing sleep.   You cough up blood.   Your pain gets worse, even with medicine.   You have a fever.   Any of your problems are getting worse, not better.   You have shortness of breath or chest pain.  MAKE SURE YOU:   Understand these instructions.   Will watch your condition.   Will get help right away if you are not doing well or get worse.  Document Released: 05/21/2008 Document Revised: 08/15/2011 Document Reviewed: 02/23/2011 [facility] Patient Information 2012 [place], [place].

## 2012-02-11 NOTE — ED Provider Notes (Signed)
History     CSN: [id number]  Arrival date & time 02/11/12  1302   First MD Initiated Contact with Patient 02/11/12 1358      Chief Complaint  Patient presents with  . Headache  . Nausea  . Emesis    (Consider location/radiation/quality/duration/timing/severity/associated sxs/prior treatment) HPI This 51 year old female who was hospitalized last week for 3 days for chest pain syndrome after unremarkable cardiac stress test with nuclear imaging at that time, she was diagnosed with wheezing and COPD and placed on prednisone albuterol and Zithromax, she was then seen in the emergency department yesterday for continued cough and according to family had a headache with vomiting but I did not notice documentation of that, doxycycline was added to her medications and the patient was discharged, yesterday's headache apparently resolved and it is unknown if it was sudden onset or not, today the patient had gradual onset of headache with nausea and vomiting at work and came back to the emergency department, history is obtained from the patient's daughter as the patient is postictal upon my arrival to the room, I was called to the room as the patient was waiting to be seen she had a witnessed generalized atraumatic seizure and by the time of my arrival the patient was combative and postictal as the seizure was brief lasting less than a minute or so. According to the daughter there is no obvious change in speech or lateralizing weakness prior to today's seizure. According to the patient's daughter the patient still smokes cigarettes, drinks alcohol, and is polysubstance drug abuse as well. According to the patient's daughter the patient has also had a prior stroke from which she has recovered. The patient lives with her daughter the patient has a primary care doctor in [place]. Past Medical History  Diagnosis Date  . Stroke   . H/O angioplasty   . COPD (chronic obstructive pulmonary disease)     Past  Surgical History  Procedure Date  . Abdominal hysterectomy   . Cholecystectomy   . Tubal ligation   . Knee arthroscopy   . Middle ear surgery     Family History  Problem Relation Age of Onset  . Heart attack Father   . Heart failure Father   . Hypertension Father   . Heart attack Brother     History  Substance Use Topics  . Smoking status: Current Everyday Smoker -- 0.2 packs/day  . Smokeless tobacco: Not on file  . Alcohol Use: Yes     occ    OB History    Grav Para Term Preterm Abortions TAB SAB Ect Mult Living                  Review of Systems  Unable to perform ROS: Mental status change    Allergies  Codeine and Adhesive  Home Medications   Current Outpatient Rx  Name Route Sig Dispense Refill  . ALPRAZOLAM 1 MG PO TABS Oral Take 1 mg by mouth 4 (four) times daily.    . ASPIRIN EC 81 MG PO TBEC Oral Take 81 mg by mouth daily.    . BC HEADACHE POWDER PO Oral Take 1 packet by mouth daily as needed. Pain    . AZITHROMYCIN 250 MG PO TABS Oral Take 1 tablet (250 mg total) by mouth daily. 5 tablet 0  . CLOPIDOGREL BISULFATE 75 MG PO TABS Oral Take 75 mg by mouth daily.    Marland Kitchen HYDROCODONE-ACETAMINOPHEN 10-325 MG PO TABS Oral Take 1 tablet  by mouth every 6 (six) hours as needed. For pain    . METOCLOPRAMIDE HCL 5 MG PO TABS Oral Take 1 tablet (5 mg total) by mouth 3 (three) times daily. 30 tablet 0  . NICOTINE 14 MG/24HR TD PT24 Transdermal Place 1 patch onto the skin daily. 28 patch 0  . PREDNISONE 20 MG PO TABS  Take 2 tablets daily for 3 days, then 1 tablet daily for 3 days, then half tablet daily for 3 days, then STOP    . PROCHLORPERAZINE MALEATE 10 MG PO TABS  Take one, three times a day as needed for nausea, or vomiting 20 tablet 0  . ZOLPIDEM TARTRATE 10 MG PO TABS Oral Take 10 mg by mouth at bedtime as needed. For sleep    . DOXYCYCLINE HYCLATE 100 MG PO CAPS Oral Take 1 capsule (100 mg total) by mouth 2 (two) times daily. 20 capsule 0    BP 161/89  Pulse  73  Temp(Src) 97.2 F (36.2 C) (Oral)  Resp 20  Ht 5\' 4"  (1.626 m)  SpO2 100%  Physical Exam  Nursing note and vitals reviewed. Constitutional:       The patient is awake looking around the room but immediately post ictal she is combative agitated and fighting pushing and kicking and grabbing requiring gentle verbal reassurance until she called from her postictal state after receiving Ativan and then she was resting comfortably on the gurney within a few minutes of her seizure.  HENT:  Head: Atraumatic.       The patient does have some postictal tongue biting  Eyes: Pupils are equal, round, and reactive to light. Right eye exhibits no discharge. Left eye exhibits no discharge.  Neck: Neck supple.  Cardiovascular: Normal rate and regular rhythm.   No murmur heard. Pulmonary/Chest: Effort normal. No respiratory distress. She has no wheezes. She has no rales. She exhibits no tenderness.  Abdominal: Soft. Bowel sounds are normal. There is no tenderness. There is no rebound.  Musculoskeletal: She exhibits no edema and no tenderness.       Baseline ROM, no obvious new focal weakness.  Lymphadenopathy:    She has no cervical adenopathy.  Neurological:       Mental status is postictal and nonverbal and motor strength appears strong and symmetric bilaterally in both arms and both legs with postictal combativeness and agitation  Skin: No rash noted.  Psychiatric: She has a normal mood and affect.    ED Course  Procedures (including critical care time) ECG: Sinus rhythm with sinus arrhythmia, ventricular rate 60, normal axis, left ventricular hypertrophy, no acute ischemic changes noted, no significant change compared with 02/10/2012  1645: The patient is now awake alert oriented to person place and time, her neurologic exam shows pupils equal and reactive, extraocular movements intact peripheral visual fields full to confrontation no facial asymmetry no pronator drift in her arms or legs she  has normal light touch normal fortunately is normal bilateral finger to nose testing and 5 out of 5 strength in all 4 extremities. The patient describes gradual onset of a headache yesterday as well as gradual onset of headache today. She states there is no sudden onset of headache daily. She states yesterday she gradual onset of gradually started feeling a throbbing pounding type headache with nausea. Today she gradual onset as well of a generalized throbbing pounding headache with nausea as well. She has not had a seizure in the past. She has amnesia for today's seizure event.  Now that she has woken up completely for her postictal period she is gradually worsening headache again similar to earlier today as well as yesterday. Labs Reviewed  GLUCOSE, CAPILLARY - Abnormal; Notable for the following:    Glucose-Capillary 162 (*)    All other components within normal limits  CBC - Abnormal; Notable for the following:    WBC 11.5 (*)    Hemoglobin 15.2 (*)    All other components within normal limits  DIFFERENTIAL - Abnormal; Notable for the following:    Neutrophils Relative 81 (*)    Neutro Abs 9.3 (*)    All other components within normal limits  COMPREHENSIVE METABOLIC PANEL - Abnormal; Notable for the following:    Chloride 94 (*)    Glucose, Bld 165 (*)    BUN 28 (*)    Calcium 10.6 (*)    AST 71 (*)    ALT 115 (*)    GFR calc non Af Amer 81 (*)    All other components within normal limits  LAB REPORT - SCANNED   Ct Head Wo Contrast  02/11/2012  *RADIOLOGY REPORT*  Clinical Data: Headache, seizure, history of stroke  CT HEAD WITHOUT CONTRAST  Technique:  Contiguous axial images were obtained from the base of the skull through the vertex without contrast.  Comparison: None.  Findings: Motion degraded images.  No evidence of parenchymal hemorrhage or extra-axial fluid collection. No mass lesion, mass effect, or midline shift.  No CT evidence of acute infarction.  Cerebral volume is age  appropriate.  No ventriculomegaly.  The visualized paranasal sinuses are essentially clear. The mastoid air cells are unopacified.  No evidence of calvarial fracture.  IMPRESSION: Motion degraded images.  No evidence of acute intracranial abnormality.  Original Report Authenticated By: Charline Bills, M.D.     1. Seizure   2. Headache   3. Elevated liver function tests       MDM  Pt feels improved after observation and/or treatment in ED.Patient / Family / Caregiver informed of clinical course, understand medical decision-making process, and agree with plan.I doubt any other EMC precluding discharge at this time including, but not necessarily limited to the following:SAH, CVA, sepsis, meningitis.  Pt/daughter want her to not take last pill of Zithromax, not start doxy, not take last few days of prednisone taper, or take Reglan or Compazine. Since no fever, cough much better, no SOB, only on burst steroids, questionable CXR PNA finding recently with clinical improvement, feel that is reasonable and outPt f/u for new onset seizure without starting anticonvulsant today.        Hurman Horn, MD 02/12/12 [phone]

## 2012-02-11 NOTE — ED Notes (Signed)
Called to pts room for seizure activity.  Pt actively seizing at present.  Dr Fonnie Jarvis notified and at bedside, 2mg  of Ativan IV ordered and given, Seizure pads placed on bed.

## 2012-02-11 NOTE — ED Notes (Signed)
Patient transported to CT 

## 2012-02-11 NOTE — ED Notes (Signed)
Pt back in room.

## 2012-02-11 NOTE — ED Notes (Signed)
Pt states pain in her head is increasing.  Dr Fonnie Jarvis notified.

## 2012-02-12 ENCOUNTER — Encounter (HOSPITAL_COMMUNITY): Payer: Medicaid Other

## 2012-02-13 ENCOUNTER — Emergency Department (HOSPITAL_COMMUNITY)
Admission: EM | Admit: 2012-02-13 | Discharge: 2012-02-13 | Disposition: A | Discharge status: Home / Self Care | Payer: Medicare Other | Attending: Emergency Medicine | Admitting: Emergency Medicine

## 2012-02-13 ENCOUNTER — Emergency Department (HOSPITAL_COMMUNITY): Payer: Medicare Other

## 2012-02-13 ENCOUNTER — Encounter (HOSPITAL_COMMUNITY): Payer: Self-pay | Admitting: *Deleted

## 2012-02-13 DIAGNOSIS — J4489 Other specified chronic obstructive pulmonary disease: Secondary | ICD-10-CM | POA: Insufficient documentation

## 2012-02-13 DIAGNOSIS — J329 Chronic sinusitis, unspecified: Secondary | ICD-10-CM

## 2012-02-13 DIAGNOSIS — Z8673 Personal history of transient ischemic attack (TIA), and cerebral infarction without residual deficits: Secondary | ICD-10-CM | POA: Insufficient documentation

## 2012-02-13 DIAGNOSIS — J449 Chronic obstructive pulmonary disease, unspecified: Secondary | ICD-10-CM | POA: Insufficient documentation

## 2012-02-13 DIAGNOSIS — F17200 Nicotine dependence, unspecified, uncomplicated: Secondary | ICD-10-CM | POA: Insufficient documentation

## 2012-02-13 DIAGNOSIS — Z9079 Acquired absence of other genital organ(s): Secondary | ICD-10-CM | POA: Insufficient documentation

## 2012-02-13 DIAGNOSIS — Z7982 Long term (current) use of aspirin: Secondary | ICD-10-CM | POA: Insufficient documentation

## 2012-02-13 DIAGNOSIS — Z9889 Other specified postprocedural states: Secondary | ICD-10-CM | POA: Insufficient documentation

## 2012-02-13 DIAGNOSIS — R51 Headache: Secondary | ICD-10-CM | POA: Insufficient documentation

## 2012-02-13 DIAGNOSIS — H9209 Otalgia, unspecified ear: Secondary | ICD-10-CM | POA: Insufficient documentation

## 2012-02-13 DIAGNOSIS — Z9851 Tubal ligation status: Secondary | ICD-10-CM | POA: Insufficient documentation

## 2012-02-13 MED ORDER — SODIUM CHLORIDE 0.9 % IV SOLN
Freq: Once | INTRAVENOUS | Status: AC
  Administered 2012-02-13: 15:00:00 via INTRAVENOUS

## 2012-02-13 MED ORDER — ONDANSETRON HCL 4 MG/2ML IJ SOLN
4.0000 mg | Freq: Once | INTRAMUSCULAR | Status: AC
  Administered 2012-02-13: 4 mg via INTRAVENOUS
  Filled 2012-02-13: qty 2

## 2012-02-13 MED ORDER — LORAZEPAM 2 MG/ML IJ SOLN
1.0000 mg | Freq: Once | INTRAMUSCULAR | Status: AC
  Administered 2012-02-13: 1 mg via INTRAVENOUS
  Filled 2012-02-13: qty 1

## 2012-02-13 MED ORDER — MECLIZINE HCL 25 MG PO TABS: 25.0000 mg | ORAL_TABLET | Freq: Four times a day (QID) | ORAL | Status: AC

## 2012-02-13 MED ORDER — AMOXICILLIN-POT CLAVULANATE 875-125 MG PO TABS: 1.0000 | ORAL_TABLET | Freq: Two times a day (BID) | ORAL | Status: AC

## 2012-02-13 NOTE — ED Provider Notes (Signed)
History     CSN: [id number]  Arrival date & time 02/13/12  1213   First MD Initiated Contact with Patient 02/13/12 1439      Chief Complaint  Patient presents with  . Headache    (Consider location/radiation/quality/duration/timing/severity/associated sxs/prior treatment) The history is provided by the patient.   patient presents with left-sided headache and ear pain x4 days. Seen in the ED x4 for similar symptoms. She had a head CT T. done 2 days ago which was negative. She relates a prior history of left middle ear surgery. States he feels excessive buzzing in her left ear which then spreads to her left temporal region of her face. No visual changes. No fever. Was given medication which is not read her symptoms. Denies any vertiginous symptoms.  Past Medical History  Diagnosis Date  . Stroke   . H/O angioplasty   . COPD (chronic obstructive pulmonary disease)     Past Surgical History  Procedure Date  . Abdominal hysterectomy   . Cholecystectomy   . Tubal ligation   . Knee arthroscopy   . Middle ear surgery     Family History  Problem Relation Age of Onset  . Heart attack Father   . Heart failure Father   . Hypertension Father   . Heart attack Brother     History  Substance Use Topics  . Smoking status: Current Everyday Smoker -- 0.2 packs/day  . Smokeless tobacco: Former Neurosurgeon    Quit date: 02/06/2012  . Alcohol Use: Yes     occ    OB History    Grav Para Term Preterm Abortions TAB SAB Ect Mult Living                  Review of Systems  All other systems reviewed and are negative.    Allergies  Codeine and Adhesive  Home Medications   Current Outpatient Rx  Name Route Sig Dispense Refill  . ALPRAZOLAM 1 MG PO TABS Oral Take 1 mg by mouth 4 (four) times daily.    . ASPIRIN EC 81 MG PO TBEC Oral Take 81 mg by mouth daily.    . BC HEADACHE POWDER PO Oral Take 1 packet by mouth daily as needed. Pain    . AZITHROMYCIN 250 MG PO TABS Oral Take 1  tablet (250 mg total) by mouth daily. 5 tablet 0  . CLOPIDOGREL BISULFATE 75 MG PO TABS Oral Take 75 mg by mouth daily.    Marland Kitchen HYDROCODONE-ACETAMINOPHEN 10-325 MG PO TABS Oral Take 1 tablet by mouth every 6 (six) hours as needed. For pain    . METOCLOPRAMIDE HCL 5 MG PO TABS Oral Take 1 tablet (5 mg total) by mouth 3 (three) times daily. 30 tablet 0  . NICOTINE 14 MG/24HR TD PT24 Transdermal Place 1 patch onto the skin daily. 28 patch 0  . PREDNISONE 20 MG PO TABS  Take 2 tablets daily for 3 days, then 1 tablet daily for 3 days, then half tablet daily for 3 days, then STOP    . PROCHLORPERAZINE MALEATE 10 MG PO TABS  Take one, three times a day as needed for nausea, or vomiting 20 tablet 0  . ZOLPIDEM TARTRATE 10 MG PO TABS Oral Take 10 mg by mouth at bedtime as needed. For sleep    . DOXYCYCLINE HYCLATE 100 MG PO CAPS Oral Take 1 capsule (100 mg total) by mouth 2 (two) times daily. 20 capsule 0    BP 147/80  Pulse 61  Temp(Src) 98.8 F (37.1 C) (Oral)  Resp 14  Ht 5\' 4"  (1.626 m)  Wt 142 lb (64.411 kg)  BMI 24.37 kg/m2  SpO2 99%  Physical Exam  Nursing note and vitals reviewed. Constitutional: She is oriented to person, place, and time. She appears well-developed and well-nourished.  Non-toxic appearance. No distress.  HENT:  Head: Normocephalic and atraumatic.  Eyes: Conjunctivae, EOM and lids are normal. Pupils are equal, round, and reactive to light.  Neck: Normal range of motion. Neck supple. No tracheal deviation present. No mass present.  Cardiovascular: Normal rate, regular rhythm and normal heart sounds.  Exam reveals no gallop.   No murmur heard. Pulmonary/Chest: Effort normal and breath sounds normal. No stridor. No respiratory distress. She has no decreased breath sounds. She has no wheezes. She has no rhonchi. She has no rales.  Abdominal: Soft. Normal appearance and bowel sounds are normal. She exhibits no distension. There is no tenderness. There is no rebound and no CVA  tenderness.  Musculoskeletal: Normal range of motion. She exhibits no edema and no tenderness.  Neurological: She is alert and oriented to person, place, and time. She has normal strength. No cranial nerve deficit or sensory deficit. GCS eye subscore is 4. GCS verbal subscore is 5. GCS motor subscore is 6.  Skin: Skin is warm and dry. No abrasion and no rash noted.  Psychiatric: She has a normal mood and affect. Her speech is normal and behavior is normal.    ED Course  Procedures (including critical care time)   Labs Reviewed  SEDIMENTATION RATE   No results found.   No diagnosis found.    MDM  Patient's x-rays reviewed with her. Findings consistent with sinusitis. Will place patient on medications and give referral to ENT        Toy Baker, MD 02/13/12 [phone]

## 2012-02-13 NOTE — ED Notes (Signed)
edp in to eval 

## 2012-02-13 NOTE — ED Notes (Signed)
Pt states that she has "waves running through her head". Also c/o nausea and vomiting. States that she has been seen 4 times here already for same.

## 2012-02-13 NOTE — Discharge Instructions (Signed)

## 2012-02-13 NOTE — ED Notes (Signed)
Family member out to desk and states her mom wants to be transferred to [facility] . Family made aware that edp would speak with her regarding this when pt is evaluated.

## 2012-02-14 ENCOUNTER — Emergency Department (HOSPITAL_COMMUNITY)
Admission: EM | Admit: 2012-02-14 | Discharge: 2012-02-15 | Disposition: A | Discharge status: Home Health | Payer: Medicare Other | Attending: Emergency Medicine | Admitting: Emergency Medicine

## 2012-02-14 ENCOUNTER — Encounter (HOSPITAL_COMMUNITY): Payer: Self-pay | Admitting: *Deleted

## 2012-02-14 DIAGNOSIS — Z8673 Personal history of transient ischemic attack (TIA), and cerebral infarction without residual deficits: Secondary | ICD-10-CM | POA: Insufficient documentation

## 2012-02-14 DIAGNOSIS — R05 Cough: Secondary | ICD-10-CM

## 2012-02-14 DIAGNOSIS — R1110 Vomiting, unspecified: Secondary | ICD-10-CM | POA: Insufficient documentation

## 2012-02-14 DIAGNOSIS — Z79899 Other long term (current) drug therapy: Secondary | ICD-10-CM | POA: Insufficient documentation

## 2012-02-14 DIAGNOSIS — J438 Other emphysema: Secondary | ICD-10-CM | POA: Insufficient documentation

## 2012-02-14 DIAGNOSIS — R059 Cough, unspecified: Secondary | ICD-10-CM | POA: Insufficient documentation

## 2012-02-14 DIAGNOSIS — R51 Headache: Secondary | ICD-10-CM | POA: Insufficient documentation

## 2012-02-14 DIAGNOSIS — Z7982 Long term (current) use of aspirin: Secondary | ICD-10-CM | POA: Insufficient documentation

## 2012-02-14 NOTE — ED Notes (Signed)
The pt has had head pressure for  5 days she has been seen at [facility]  X 4 and she says she is no better

## 2012-02-15 ENCOUNTER — Emergency Department (HOSPITAL_COMMUNITY): Payer: Medicare Other

## 2012-02-15 MED ORDER — ONDANSETRON HCL 4 MG/2ML IJ SOLN
4.0000 mg | Freq: Once | INTRAMUSCULAR | Status: AC
  Administered 2012-02-15: 4 mg via INTRAVENOUS
  Filled 2012-02-15: qty 2

## 2012-02-15 MED ORDER — ALBUTEROL SULFATE (5 MG/ML) 0.5% IN NEBU
5.0000 mg | INHALATION_SOLUTION | Freq: Once | RESPIRATORY_TRACT | Status: AC
  Administered 2012-02-15: 5 mg via RESPIRATORY_TRACT
  Filled 2012-02-15: qty 1

## 2012-02-15 MED ORDER — IPRATROPIUM BROMIDE 0.02 % IN SOLN
0.5000 mg | Freq: Once | RESPIRATORY_TRACT | Status: AC
  Administered 2012-02-15: 0.5 mg via RESPIRATORY_TRACT
  Filled 2012-02-15: qty 2.5

## 2012-02-15 MED ORDER — SODIUM CHLORIDE 0.9 % IV BOLUS (SEPSIS)
1000.0000 mL | Freq: Once | INTRAVENOUS | Status: AC
  Administered 2012-02-15: 1000 mL via INTRAVENOUS

## 2012-02-15 MED ORDER — MORPHINE SULFATE 4 MG/ML IJ SOLN
4.0000 mg | Freq: Once | INTRAMUSCULAR | Status: AC
  Administered 2012-02-15: 4 mg via INTRAVENOUS
  Filled 2012-02-15: qty 1

## 2012-02-15 MED ORDER — KETOROLAC TROMETHAMINE 30 MG/ML IJ SOLN
30.0000 mg | Freq: Once | INTRAMUSCULAR | Status: AC
  Administered 2012-02-15: 30 mg via INTRAVENOUS
  Filled 2012-02-15: qty 1

## 2012-02-15 MED ORDER — HYDROCODONE-ACETAMINOPHEN 5-500 MG PO TABS: 1.0000 | ORAL_TABLET | Freq: Four times a day (QID) | ORAL | Status: AC | PRN

## 2012-02-15 MED ORDER — ALBUTEROL SULFATE HFA 108 (90 BASE) MCG/ACT IN AERS
2.0000 | INHALATION_SPRAY | RESPIRATORY_TRACT | Status: DC
  Administered 2012-02-15: 2 via RESPIRATORY_TRACT
  Filled 2012-02-15: qty 6.7

## 2012-02-15 NOTE — ED Provider Notes (Signed)
History     CSN: [id number]  Arrival date & time 02/14/12  2305   First MD Initiated Contact with Patient 02/15/12 0015      Chief Complaint  Patient presents with  . Headache    The history is provided by the patient.   the patient reports she's continued to have a left-sided headache and head pressure for approximately 5 days.  She's been seen in the ER and had a CT scan of her head and a CT scan of her temporal bones both of which were without significant abnormality except for sinusitis.  She was started on Augmentin.  She reports her pain in her left side of her head continues to hurt.  She's had no change in her vision.  She reports the pain was severe today and she began vomiting.  She also reports a history of emphysema for which she is treated with 4 L of oxygen at all times.  She reports ongoing productive cough.  She no longer smokes cigarettes.  She was recently started on antibiotics that were thought to cover possible pneumonia.  She reports she has no rescue inhaler at home.  She's not tried anything that has helped her cough.  Her pain is moderate at this time.  Nothing worsens her symptoms.  Nothing improves her symptoms.  She has no photophobia or phonophobia.  She has no changes in her vision.  She denies abdominal pain.  She denies chest pain.  She denies shortness of breath  Past Medical History  Diagnosis Date  . Stroke   . H/O angioplasty   . COPD (chronic obstructive pulmonary disease)     Past Surgical History  Procedure Date  . Abdominal hysterectomy   . Cholecystectomy   . Tubal ligation   . Knee arthroscopy   . Middle ear surgery     Family History  Problem Relation Age of Onset  . Heart attack Father   . Heart failure Father   . Hypertension Father   . Heart attack Brother     History  Substance Use Topics  . Smoking status: Current Everyday Smoker -- 0.2 packs/day  . Smokeless tobacco: Former Neurosurgeon    Quit date: 02/06/2012  . Alcohol Use: Yes       occ    OB History    Grav Para Term Preterm Abortions TAB SAB Ect Mult Living                  Review of Systems  Neurological: Positive for headaches.  All other systems reviewed and are negative.    Allergies  Codeine and Adhesive  Home Medications   Current Outpatient Rx  Name Route Sig Dispense Refill  . ALPRAZOLAM 1 MG PO TABS Oral Take 1 mg by mouth 4 (four) times daily.    . AMOXICILLIN-POT CLAVULANATE 875-125 MG PO TABS Oral Take 1 tablet by mouth every 12 (twelve) hours. 14 tablet 0  . ASPIRIN EC 81 MG PO TBEC Oral Take 81 mg by mouth daily.    . BC HEADACHE POWDER PO Oral Take 1 packet by mouth daily as needed. Pain    . AZITHROMYCIN 250 MG PO TABS Oral Take 1 tablet (250 mg total) by mouth daily. 5 tablet 0  . CLOPIDOGREL BISULFATE 75 MG PO TABS Oral Take 75 mg by mouth daily.    Marland Kitchen DOXYCYCLINE HYCLATE 100 MG PO CAPS Oral Take 1 capsule (100 mg total) by mouth 2 (two) times daily. 20 capsule  0  . HYDROCODONE-ACETAMINOPHEN 10-325 MG PO TABS Oral Take 1 tablet by mouth every 6 (six) hours as needed. For pain    . MECLIZINE HCL 25 MG PO TABS Oral Take 1 tablet (25 mg total) by mouth 4 (four) times daily. 28 tablet 0  . METOCLOPRAMIDE HCL 5 MG PO TABS Oral Take 1 tablet (5 mg total) by mouth 3 (three) times daily. 30 tablet 0  . NICOTINE 14 MG/24HR TD PT24 Transdermal Place 1 patch onto the skin daily. 28 patch 0  . PREDNISONE 20 MG PO TABS  Take 2 tablets daily for 3 days, then 1 tablet daily for 3 days, then half tablet daily for 3 days, then STOP    . PROCHLORPERAZINE MALEATE 10 MG PO TABS Oral Take 10 mg by mouth every 8 (eight) hours as needed. For nausea    . ZOLPIDEM TARTRATE 10 MG PO TABS Oral Take 10 mg by mouth at bedtime as needed. For sleep    . HYDROCODONE-ACETAMINOPHEN 5-500 MG PO TABS Oral Take 1 tablet by mouth every 6 (six) hours as needed for pain. 12 tablet 0    BP 159/79  Pulse 68  Temp(Src) 97.9 F (36.6 C) (Oral)  Resp 20  SpO2  98%  Physical Exam  Nursing note and vitals reviewed. Constitutional: She is oriented to person, place, and time. She appears well-developed and well-nourished. No distress.  HENT:  Head: Normocephalic and atraumatic.  Eyes: EOM are normal. Pupils are equal, round, and reactive to light.  Neck: Normal range of motion.  Cardiovascular: Normal rate, regular rhythm and normal heart sounds.   Pulmonary/Chest: Effort normal and breath sounds normal. She has no wheezes. She has no rales.  Abdominal: Soft. She exhibits no distension. There is no tenderness.  Musculoskeletal: Normal range of motion.  Neurological: She is alert and oriented to person, place, and time.       Normal strength in bilateral upper and lower extremities.  Skin: Skin is warm and dry.  Psychiatric: She has a normal mood and affect. Judgment normal.    ED Course  Procedures (including critical care time)  Labs Reviewed - No data to display Dg Chest 2 View  02/15/2012  *RADIOLOGY REPORT*  Clinical Data: Cough and vomiting for 1 week.  CHEST - 2 VIEW  Comparison: Chest radiograph performed 02/10/2012  Findings: The lungs are well-aerated and appear grossly clear. There is no evidence of focal opacification, pleural effusion or pneumothorax.  The heart is normal in size; the mediastinal contour is within normal limits.  No acute osseous abnormalities are seen.  Clips are noted within the right upper quadrant, reflecting prior cholecystectomy.  IMPRESSION: Previously noted minimal left basilar opacity appears to have resolved; no definite focal airspace consolidation seen.  Original Report Authenticated By: Tonia Ghent, M.D.   Ct Temporal Bones W/o Cm  02/13/2012  *RADIOLOGY REPORT*  Clinical Data: 51 year old female with left side headache and ear pain times 4 days.  Prior left middle ear surgery.  Tinnitus.  CT TEMPORAL BONES WITHOUT CONTRAST  Technique:  Axial and coronal plane CT imaging of the petrous temporal bones was  performed with thin-collimation image reconstruction.  No intravenous contrast was administered. Multiplanar CT image reconstructions were also generated.  Comparison: Head CT 02/11/2012.  Findings: Stable and negative noncontrast visualized brain parenchyma. Visualized orbit soft tissues are within normal limits. Visualized scalp soft tissues are within normal limits.  Negative visualized deep soft tissue spaces of the face except for some  retained secretions in the nasopharynx.  Mild posterior ethmoid, sphenoid, and visualized maxillary sinus inflammatory changes. Bone mineralization is within normal limits.  Left temporal bone:  Left external auditory canals within normal limits.  Left tympanic cavity is normally pneumatized.  Metallic density left stapes implant is demonstrated.  Otherwise the ossicles are within normal limits.  The mastoids are clear.  Left IAC, cochlea, vestibule, semicircular canals, and course of the left seventh nerve are within normal limits.  Right temporal bone:  Negative right EAC.  Minimal thickening of the right tympanic membrane.  Right tympanic cavity is clear. Right stapes metallic density implant similar that seen on the left.  Right ossicles otherwise within normal limits.  Right mastoids are clear.  IAC, cochlea, vestibule, semicircular canals, and course of the right seventh nerve are within normal limits.  IMPRESSION: 1.  No middle ear or mastoid inflammatory changes. Paranasal sinus inflammatory changes with bubbly opacity suggestive of acute sinusitis. 2.  Bilateral metallic density stapes ossicular implants. Otherwise negative temporal bones.  Original Report Authenticated By: Harley Hallmark, M.D.     1. Cough   2. Headache       MDM  1:39 AM The patient feels much better at this time.  She reports resolution of her headache.  She is no longer vomiting.  She reports her cough is much better.  I suspect this is a bronchitis.  The patient's oxygen saturation was  low on arrival because she is post be on 4 L home O2 however she came to the ER without her oxygen tank.  She has followup with her primary care doctor later today.  She'll followup with him at that time.  She's been encouraged to continue her antibiotics for sinusitis.  I personally reviewed her prior ER visits as well as her CT of her head in her CT of her temporal bones obtain the last several days.        Lyanne Co, MD 02/15/12 [phone]

## 2012-02-15 NOTE — ED Notes (Signed)
Patient is AOx4 and comfortable with her discharge instructions. 

## 2012-02-15 NOTE — ED Notes (Signed)
Transport tech advised patient requires O2 for the trip to radiology.

## 2012-02-26 NOTE — Progress Notes (Signed)
UR Chart Review Completed  

## 2012-09-25 ENCOUNTER — Emergency Department (HOSPITAL_COMMUNITY): Payer: Medicare Other

## 2012-09-25 ENCOUNTER — Encounter (HOSPITAL_COMMUNITY): Payer: Self-pay

## 2012-09-25 ENCOUNTER — Emergency Department (HOSPITAL_COMMUNITY)
Admission: EM | Admit: 2012-09-25 | Discharge: 2012-09-25 | Disposition: A | Discharge status: Home / Self Care | Payer: Medicare Other | Attending: Emergency Medicine | Admitting: Emergency Medicine

## 2012-09-25 DIAGNOSIS — Z8673 Personal history of transient ischemic attack (TIA), and cerebral infarction without residual deficits: Secondary | ICD-10-CM | POA: Insufficient documentation

## 2012-09-25 DIAGNOSIS — J449 Chronic obstructive pulmonary disease, unspecified: Secondary | ICD-10-CM | POA: Insufficient documentation

## 2012-09-25 DIAGNOSIS — Z885 Allergy status to narcotic agent status: Secondary | ICD-10-CM | POA: Insufficient documentation

## 2012-09-25 DIAGNOSIS — J4489 Other specified chronic obstructive pulmonary disease: Secondary | ICD-10-CM | POA: Insufficient documentation

## 2012-09-25 DIAGNOSIS — Z9861 Coronary angioplasty status: Secondary | ICD-10-CM | POA: Insufficient documentation

## 2012-09-25 DIAGNOSIS — F17200 Nicotine dependence, unspecified, uncomplicated: Secondary | ICD-10-CM | POA: Insufficient documentation

## 2012-09-25 DIAGNOSIS — M779 Enthesopathy, unspecified: Secondary | ICD-10-CM

## 2012-09-25 MED ORDER — TRAMADOL HCL 50 MG PO TABS: 50.0000 mg | ORAL_TABLET | Freq: Four times a day (QID) | ORAL | Status: DC | PRN

## 2012-09-25 MED ORDER — TRAMADOL HCL 50 MG PO TABS
ORAL_TABLET | ORAL | Status: AC
  Administered 2012-09-25: 50 mg via ORAL
  Filled 2012-09-25: qty 1

## 2012-09-25 MED ORDER — TRAMADOL HCL 50 MG PO TABS
50.0000 mg | ORAL_TABLET | Freq: Once | ORAL | Status: AC
  Administered 2012-09-25: 50 mg via ORAL

## 2012-09-25 MED ORDER — NAPROXEN 500 MG PO TABS: 500.0000 mg | ORAL_TABLET | Freq: Two times a day (BID) | ORAL | Status: DC

## 2012-09-25 NOTE — ED Provider Notes (Signed)
History     CSN: [id number]  Arrival date & time 09/25/12  0016   First MD Initiated Contact with Patient 09/25/12 0024      Chief Complaint  Patient presents with  . Arm Pain  . Numbness    (Consider location/radiation/quality/duration/timing/severity/associated sxs/prior treatment) Patient is a 51 y.o. female presenting with arm pain. The history is provided by the patient (pt complains of pain in left elbow). No language interpreter was used.  Arm Pain This is a new problem. The current episode started more than 2 days ago. The problem occurs constantly. The problem has not changed since onset.Pertinent negatives include no chest pain, no abdominal pain and no headaches. Exacerbated by: movement. Nothing relieves the symptoms. She has tried nothing for the symptoms. The treatment provided no relief.    Past Medical History  Diagnosis Date  . Stroke   . H/O angioplasty   . COPD (chronic obstructive pulmonary disease)     Past Surgical History  Procedure Date  . Abdominal hysterectomy   . Cholecystectomy   . Tubal ligation   . Knee arthroscopy   . Middle ear surgery     Family History  Problem Relation Age of Onset  . Heart attack Father   . Heart failure Father   . Hypertension Father   . Heart attack Brother     History  Substance Use Topics  . Smoking status: Current Every Day Smoker -- 0.2 packs/day  . Smokeless tobacco: Former Neurosurgeon    Quit date: 02/06/2012  . Alcohol Use: Yes     occ    OB History    Grav Para Term Preterm Abortions TAB SAB Ect Mult Living                  Review of Systems  Constitutional: Negative for fatigue.  HENT: Negative for congestion, sinus pressure and ear discharge.   Eyes: Negative for discharge.  Respiratory: Negative for cough.   Cardiovascular: Negative for chest pain.  Gastrointestinal: Negative for abdominal pain and diarrhea.  Genitourinary: Negative for frequency and hematuria.  Musculoskeletal: Negative  for back pain.       Left elbow pain  Skin: Negative for rash.  Neurological: Negative for seizures and headaches.  Hematological: Negative.   Psychiatric/Behavioral: Negative for hallucinations.    Allergies  Codeine and Adhesive  Home Medications   Current Outpatient Rx  Name Route Sig Dispense Refill  . ALPRAZOLAM 1 MG PO TABS Oral Take 1 mg by mouth 4 (four) times daily.    . ASPIRIN EC 81 MG PO TBEC Oral Take 81 mg by mouth daily.    . BC HEADACHE POWDER PO Oral Take 1 packet by mouth daily as needed. Pain    . AZITHROMYCIN 250 MG PO TABS Oral Take 1 tablet (250 mg total) by mouth daily. 5 tablet 0  . CLOPIDOGREL BISULFATE 75 MG PO TABS Oral Take 75 mg by mouth daily.    Marland Kitchen HYDROCODONE-ACETAMINOPHEN 10-325 MG PO TABS Oral Take 1 tablet by mouth every 6 (six) hours as needed. For pain    . METOCLOPRAMIDE HCL 5 MG PO TABS Oral Take 1 tablet (5 mg total) by mouth 3 (three) times daily. 30 tablet 0  . NAPROXEN 500 MG PO TABS Oral Take 1 tablet (500 mg total) by mouth 2 (two) times daily. 30 tablet 0  . PREDNISONE 20 MG PO TABS  Take 2 tablets daily for 3 days, then 1 tablet daily for 3 days,  then half tablet daily for 3 days, then STOP    . PROCHLORPERAZINE MALEATE 10 MG PO TABS Oral Take 10 mg by mouth every 8 (eight) hours as needed. For nausea    . TRAMADOL HCL 50 MG PO TABS Oral Take 1 tablet (50 mg total) by mouth every 6 (six) hours as needed for pain. 30 tablet 0  . ZOLPIDEM TARTRATE 10 MG PO TABS Oral Take 10 mg by mouth at bedtime as needed. For sleep      BP 154/82  Pulse 87  Temp 98 F (36.7 C) (Oral)  Resp 18  SpO2 97%  Physical Exam  Constitutional: She is oriented to person, place, and time. She appears well-developed.  HENT:  Head: Normocephalic.  Eyes: Conjunctivae normal are normal.  Neck: No tracheal deviation present.  Cardiovascular:  No murmur heard. Musculoskeletal:       Tender medial left elbow.  Neuro vasc normal.    Neurological: She is oriented  to person, place, and time.  Skin: Skin is warm.  Psychiatric: She has a normal mood and affect.    ED Course  Procedures (including critical care time)  Labs Reviewed - No data to display Dg Elbow Complete Left  09/25/2012  *RADIOLOGY REPORT*  Clinical Data: Left elbow pain and numbness for 2 weeks, no known injury  LEFT ELBOW - COMPLETE 3+ VIEW  Comparison: None.  Findings:  No fracture or elbow joint effusion.  Joint spaces are preserved. Regional soft tissues are normal.  No radiopaque foreign body.  IMPRESSION: Unremarkable radiographs of the left elbow.   Original Report Authenticated By: Waynard Reeds, M.D.      1. Tendonitis       MDM          [name], MD 09/25/12 [phone]

## 2012-09-25 NOTE — ED Notes (Signed)
Pt c/o pain to left elbow area, states has swelling to same and tonight with some numbness from elbow to hand.

## 2012-10-31 ENCOUNTER — Emergency Department (HOSPITAL_COMMUNITY): Payer: Medicare Other

## 2012-10-31 ENCOUNTER — Emergency Department (HOSPITAL_COMMUNITY)
Admission: EM | Admit: 2012-10-31 | Discharge: 2012-10-31 | Disposition: A | Discharge status: Home / Self Care | Payer: Medicare Other | Attending: Emergency Medicine | Admitting: Emergency Medicine

## 2012-10-31 ENCOUNTER — Encounter (HOSPITAL_COMMUNITY): Payer: Self-pay | Admitting: *Deleted

## 2012-10-31 DIAGNOSIS — Z8673 Personal history of transient ischemic attack (TIA), and cerebral infarction without residual deficits: Secondary | ICD-10-CM | POA: Insufficient documentation

## 2012-10-31 DIAGNOSIS — Z79899 Other long term (current) drug therapy: Secondary | ICD-10-CM | POA: Insufficient documentation

## 2012-10-31 DIAGNOSIS — J449 Chronic obstructive pulmonary disease, unspecified: Secondary | ICD-10-CM | POA: Insufficient documentation

## 2012-10-31 DIAGNOSIS — Z9861 Coronary angioplasty status: Secondary | ICD-10-CM | POA: Insufficient documentation

## 2012-10-31 DIAGNOSIS — J4489 Other specified chronic obstructive pulmonary disease: Secondary | ICD-10-CM | POA: Insufficient documentation

## 2012-10-31 DIAGNOSIS — IMO0001 Reserved for inherently not codable concepts without codable children: Secondary | ICD-10-CM | POA: Insufficient documentation

## 2012-10-31 DIAGNOSIS — F17200 Nicotine dependence, unspecified, uncomplicated: Secondary | ICD-10-CM | POA: Insufficient documentation

## 2012-10-31 DIAGNOSIS — Z7982 Long term (current) use of aspirin: Secondary | ICD-10-CM | POA: Insufficient documentation

## 2012-10-31 DIAGNOSIS — J40 Bronchitis, not specified as acute or chronic: Secondary | ICD-10-CM

## 2012-10-31 MED ORDER — ALBUTEROL SULFATE (5 MG/ML) 0.5% IN NEBU
5.0000 mg | INHALATION_SOLUTION | Freq: Once | RESPIRATORY_TRACT | Status: AC
  Administered 2012-10-31: 5 mg via RESPIRATORY_TRACT
  Filled 2012-10-31: qty 1

## 2012-10-31 MED ORDER — PREDNISONE 10 MG PO TABS: 20.0000 mg | ORAL_TABLET | Freq: Every day | ORAL | Status: DC

## 2012-10-31 MED ORDER — IPRATROPIUM BROMIDE 0.02 % IN SOLN
0.5000 mg | Freq: Once | RESPIRATORY_TRACT | Status: AC
  Administered 2012-10-31: 0.5 mg via RESPIRATORY_TRACT
  Filled 2012-10-31: qty 2.5

## 2012-10-31 MED ORDER — PREDNISONE 50 MG PO TABS
60.0000 mg | ORAL_TABLET | Freq: Once | ORAL | Status: AC
  Administered 2012-10-31: 60 mg via ORAL
  Filled 2012-10-31: qty 1

## 2012-10-31 MED ORDER — AMOXICILLIN 500 MG PO CAPS: 500.0000 mg | ORAL_CAPSULE | Freq: Three times a day (TID) | ORAL | Status: DC

## 2012-10-31 NOTE — ED Notes (Signed)
Cough for 1 week , no fever,  Brown sputum,  Body aches

## 2012-10-31 NOTE — ED Notes (Signed)
Report received from off going RN.  Respiratory Therapist here to administer treatment

## 2012-10-31 NOTE — ED Provider Notes (Signed)
History  This chart was scribed for [name], MD by Shari Heritage, ED Scribe. The patient was seen in room [facility]06[facility]. Patient's care was started at 1833.  CSN: [id number]  Arrival date & time 10/31/12  1478   First MD Initiated Contact with Patient 10/31/12 1833      Chief Complaint  Patient presents with  . Cough    Patient is a 51 y.o. female presenting with cough. The history is provided by the patient. No language interpreter was used.  Cough This is a new problem. The current episode started more than 2 days ago. The problem occurs constantly. The problem has not changed since onset.The cough is productive of brown sputum. There has been no fever. Associated symptoms include myalgias and wheezing. Pertinent negatives include no chest pain and no headaches. She has tried mist for the symptoms. The treatment provided no relief. She is a smoker. Her past medical history is significant for COPD.    HPI Comments: Olivia Manning is a 51 y.o. female with a history of COPD who presents to the Emergency Department complaining of persistent, moderate, productive cough. Patient says that she is producing brown sputum. There is associated wheezing and generalized body aches. Patient says takes breathing treatments at home, but they have not relieved her cough or wheezing. Patient denies fever or any other symptoms. Other medical history includes stroke and angioplasty. She has a surgical history of abdominal hysterectomy and cholecystectomy. She is a current every day smoker, but has attempted to quit multiple times.  Past Medical History  Diagnosis Date  . Stroke   . H/O angioplasty   . COPD (chronic obstructive pulmonary disease)     Past Surgical History  Procedure Date  . Abdominal hysterectomy   . Cholecystectomy   . Tubal ligation   . Knee arthroscopy   . Middle ear surgery     Family History  Problem Relation Age of Onset  . Heart attack Father   . Heart failure Father    . Hypertension Father   . Heart attack Brother     History  Substance Use Topics  . Smoking status: Current Every Day Smoker -- 0.2 packs/day  . Smokeless tobacco: Former Neurosurgeon    Quit date: 02/06/2012  . Alcohol Use: Yes     Comment: occ    OB History    Grav Para Term Preterm Abortions TAB SAB Ect Mult Living                  Review of Systems  Constitutional: Negative for fatigue.  HENT: Negative for congestion, sinus pressure and ear discharge.   Eyes: Negative for discharge.  Respiratory: Positive for cough and wheezing.   Cardiovascular: Negative for chest pain.  Gastrointestinal: Negative for abdominal pain and diarrhea.  Genitourinary: Negative for frequency and hematuria.  Musculoskeletal: Positive for myalgias. Negative for back pain.  Skin: Negative for rash.  Neurological: Negative for seizures and headaches.  Hematological: Negative.   Psychiatric/Behavioral: Negative for hallucinations.    Allergies  Codeine and Adhesive  Home Medications   Current Outpatient Rx  Name  Route  Sig  Dispense  Refill  . ALPRAZOLAM 1 MG PO TABS   Oral   Take 1 mg by mouth 4 (four) times daily.         . ASPIRIN EC 81 MG PO TBEC   Oral   Take 81 mg by mouth daily.         Marland Kitchen  BC HEADACHE POWDER PO   Oral   Take 1 packet by mouth daily as needed. Pain         . AZITHROMYCIN 250 MG PO TABS   Oral   Take 1 tablet (250 mg total) by mouth daily.   5 tablet   0   . CLOPIDOGREL BISULFATE 75 MG PO TABS   Oral   Take 75 mg by mouth daily.         Marland Kitchen HYDROCODONE-ACETAMINOPHEN 10-325 MG PO TABS   Oral   Take 1 tablet by mouth every 6 (six) hours as needed. For pain         . METOCLOPRAMIDE HCL 5 MG PO TABS   Oral   Take 1 tablet (5 mg total) by mouth 3 (three) times daily.   30 tablet   0   . NAPROXEN 500 MG PO TABS   Oral   Take 1 tablet (500 mg total) by mouth 2 (two) times daily.   30 tablet   0   . PREDNISONE 20 MG PO TABS      Take 2 tablets  daily for 3 days, then 1 tablet daily for 3 days, then half tablet daily for 3 days, then STOP         . PROCHLORPERAZINE MALEATE 10 MG PO TABS   Oral   Take 10 mg by mouth every 8 (eight) hours as needed. For nausea         . TRAMADOL HCL 50 MG PO TABS   Oral   Take 1 tablet (50 mg total) by mouth every 6 (six) hours as needed for pain.   30 tablet   0   . ZOLPIDEM TARTRATE 10 MG PO TABS   Oral   Take 10 mg by mouth at bedtime as needed. For sleep           Triage Vitals: BP 114/78  Pulse 82  Temp 98 F (36.7 C) (Oral)  Resp 20  Ht 5\' 3"  (1.6 m)  Wt 154 lb (69.854 kg)  BMI 27.28 kg/m2  SpO2 99%  Physical Exam  Constitutional: She is oriented to person, place, and time. She appears well-developed.  HENT:  Head: Normocephalic and atraumatic.  Eyes: Conjunctivae normal and EOM are normal. No scleral icterus.  Neck: Neck supple. No thyromegaly present.  Cardiovascular: Normal rate and regular rhythm.  Exam reveals no gallop and no friction rub.   No murmur heard. Pulmonary/Chest: No stridor. She has wheezes (minimal wheezing bilaterally). She has no rales. She exhibits no tenderness.  Abdominal: She exhibits no distension. There is no tenderness. There is no rebound.  Musculoskeletal: Normal range of motion. She exhibits no edema.  Lymphadenopathy:    She has no cervical adenopathy.  Neurological: She is oriented to person, place, and time. Coordination normal.  Skin: No rash noted. No erythema.  Psychiatric: She has a normal mood and affect. Her behavior is normal.    ED Course  Procedures (including critical care time) DIAGNOSTIC STUDIES: Oxygen Saturation is 99% on room air, normal by my interpretation.    COORDINATION OF CARE: 6:36 PM- Patient informed of current plan for treatment and evaluation and agrees with plan at this time.    Labs Reviewed - No data to display  Dg Chest 2 View  10/31/2012  *RADIOLOGY REPORT*  Clinical Data: Cough, shortness of  breath  CHEST - 2 VIEW  Comparison: None.  Findings: Chronic interstitial markings/emphysematous changes. No pleural effusion or pneumothorax.  Cardiomediastinal silhouette is within normal limits.  Mild degenerative changes of the visualized thoracolumbar spine.  IMPRESSION: No evidence of acute cardiopulmonary disease.   Original Report Authenticated By: Charline Bills, M.D.      No diagnosis found.  Pt improved with tx  MDM          [name], MD 10/31/12 2020

## 2012-10-31 NOTE — ED Notes (Signed)
Water given at patient's request. 

## 2012-11-08 IMAGING — CR DG CHEST 2V
2 series · 2 of 2 positions shown · non-contrast
Comparison: Chest radiograph performed 02/10/2012

CLINICAL DATA: Cough and vomiting for 1 week.

CHEST - 2 VIEW

[w chest pa]
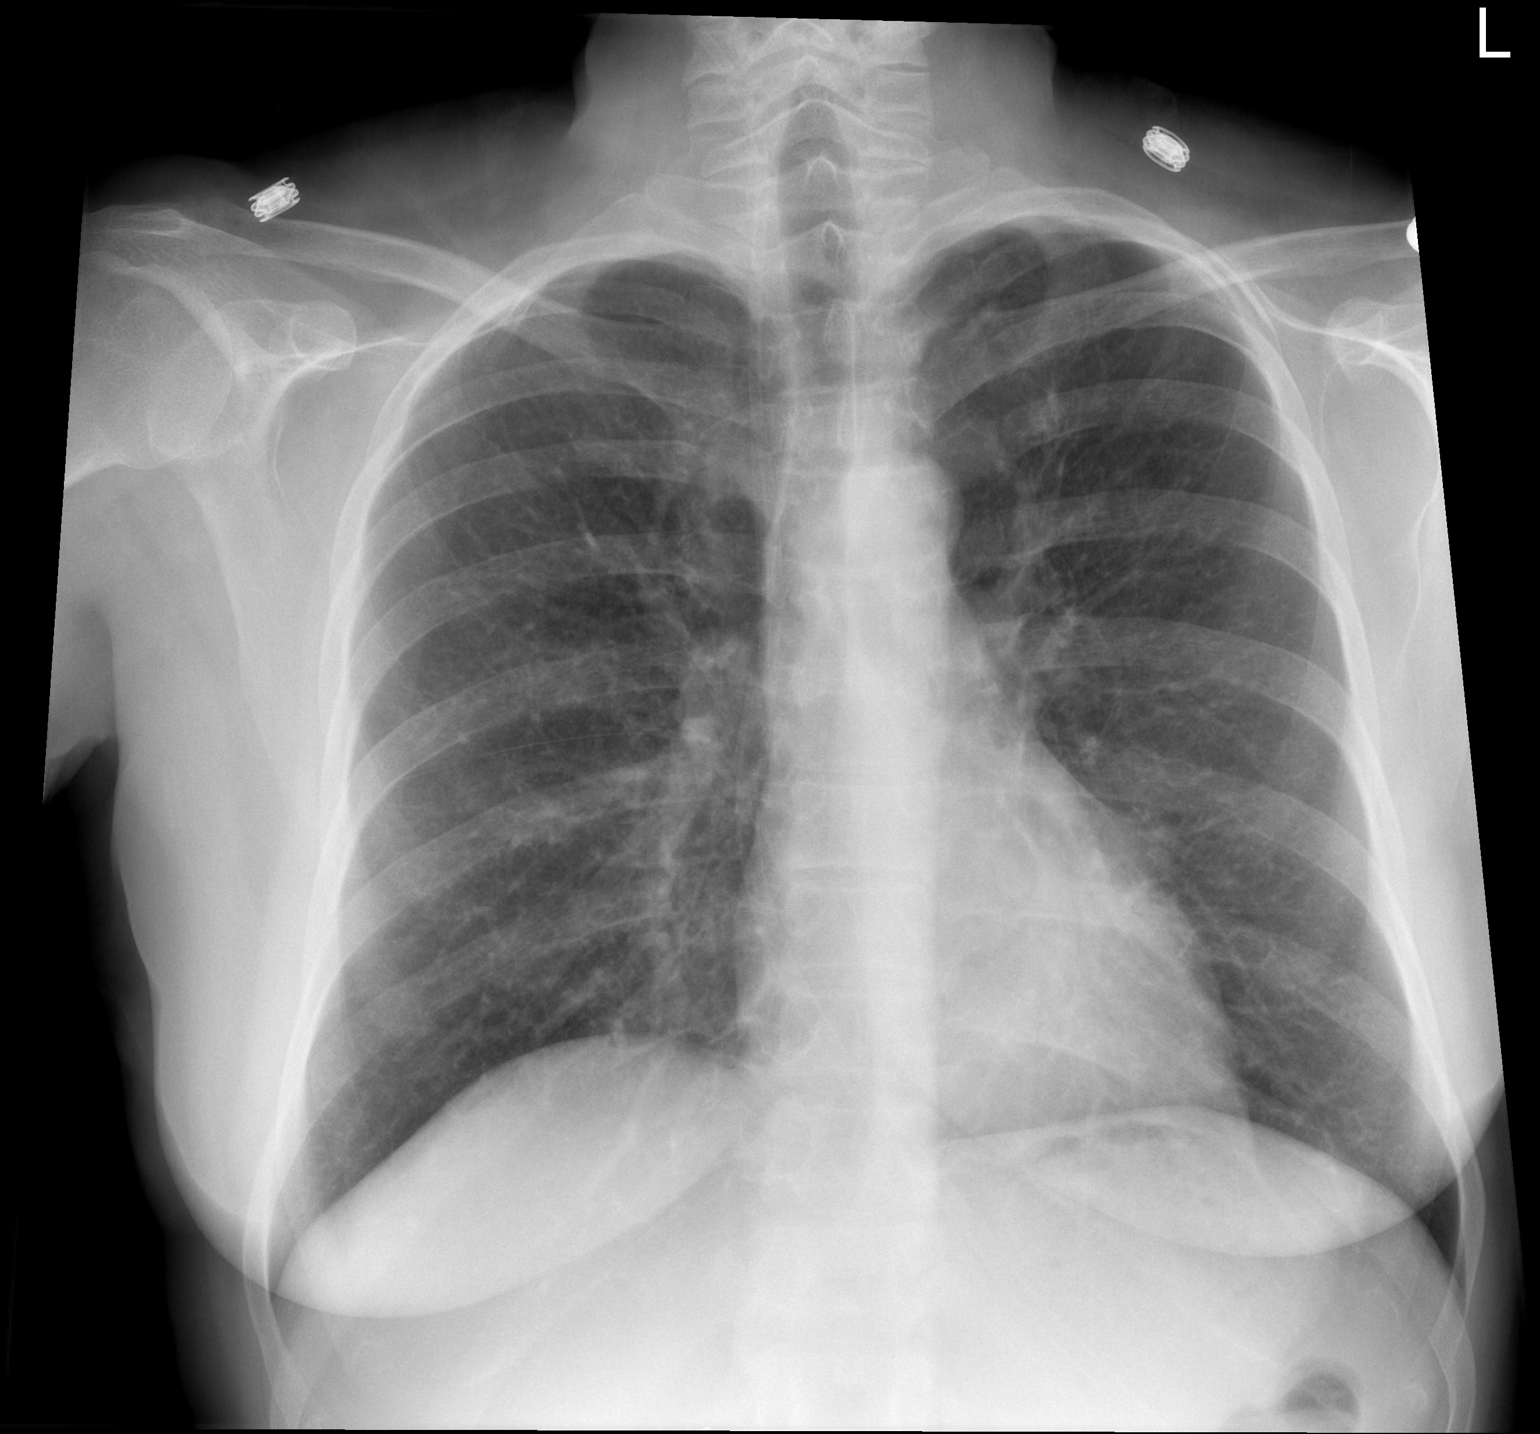

[w chest lat]
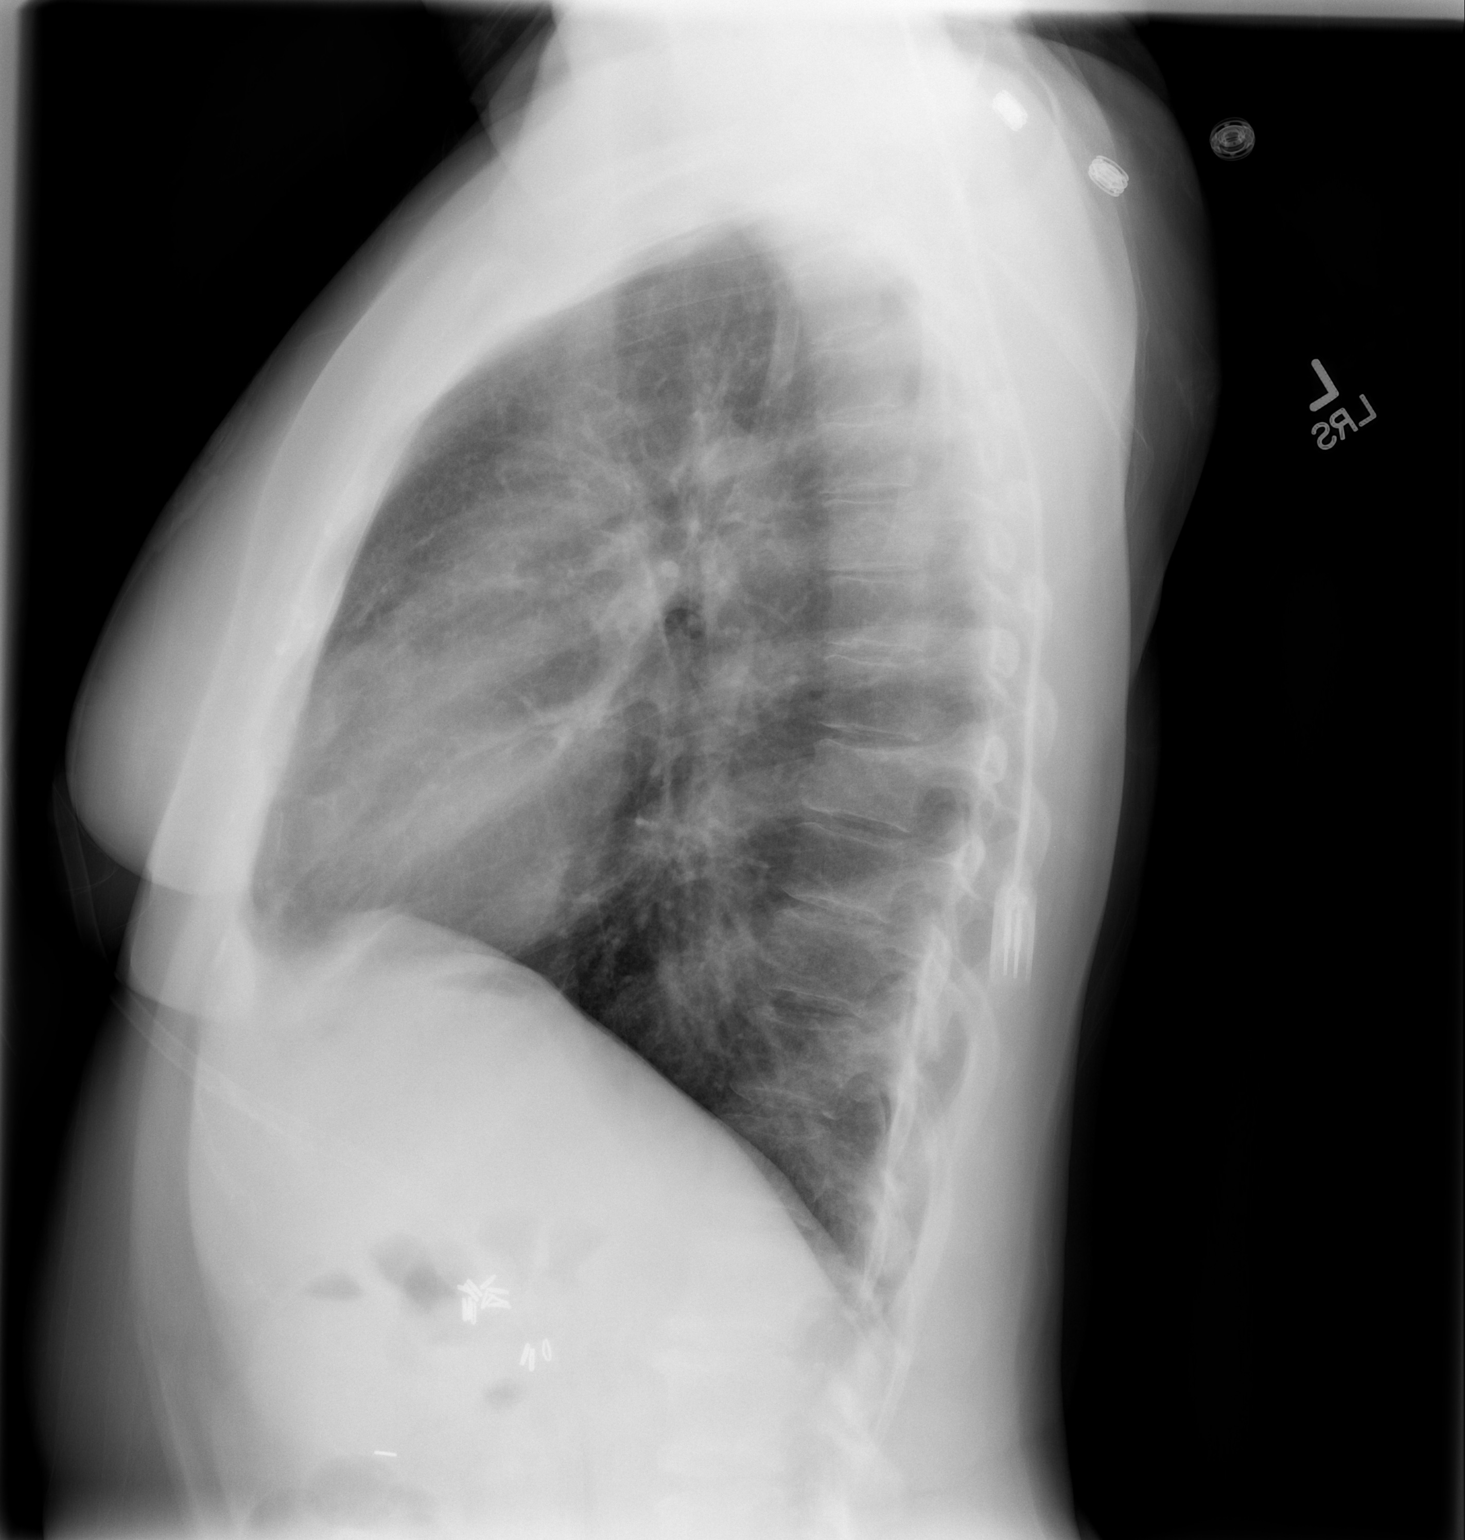

[2 of 2 positions shown; findings below may reference images not displayed]

FINDINGS: The lungs are well-aerated and appear grossly clear.
There is no evidence of focal opacification, pleural effusion or
pneumothorax.

The heart is normal in size; the mediastinal contour is within
normal limits.  No acute osseous abnormalities are seen.  Clips are
noted within the right upper quadrant, reflecting prior
cholecystectomy.
IMPRESSION: Previously noted minimal left basilar opacity appears to have
resolved; no definite focal airspace consolidation seen.

## 2012-11-17 ENCOUNTER — Encounter (HOSPITAL_COMMUNITY): Payer: Self-pay | Admitting: *Deleted

## 2012-11-17 ENCOUNTER — Emergency Department (HOSPITAL_COMMUNITY): Payer: Medicare Other

## 2012-11-17 ENCOUNTER — Emergency Department (HOSPITAL_COMMUNITY)
Admission: EM | Admit: 2012-11-17 | Discharge: 2012-11-17 | Disposition: A | Discharge status: Home / Self Care | Payer: Medicare Other | Attending: Emergency Medicine | Admitting: Emergency Medicine

## 2012-11-17 DIAGNOSIS — J9801 Acute bronchospasm: Secondary | ICD-10-CM | POA: Insufficient documentation

## 2012-11-17 DIAGNOSIS — Z8673 Personal history of transient ischemic attack (TIA), and cerebral infarction without residual deficits: Secondary | ICD-10-CM | POA: Insufficient documentation

## 2012-11-17 DIAGNOSIS — R062 Wheezing: Secondary | ICD-10-CM | POA: Insufficient documentation

## 2012-11-17 DIAGNOSIS — R0602 Shortness of breath: Secondary | ICD-10-CM | POA: Insufficient documentation

## 2012-11-17 DIAGNOSIS — F17200 Nicotine dependence, unspecified, uncomplicated: Secondary | ICD-10-CM | POA: Insufficient documentation

## 2012-11-17 DIAGNOSIS — Z9889 Other specified postprocedural states: Secondary | ICD-10-CM | POA: Insufficient documentation

## 2012-11-17 DIAGNOSIS — J441 Chronic obstructive pulmonary disease with (acute) exacerbation: Secondary | ICD-10-CM | POA: Insufficient documentation

## 2012-11-17 DIAGNOSIS — Z79899 Other long term (current) drug therapy: Secondary | ICD-10-CM | POA: Insufficient documentation

## 2012-11-17 DIAGNOSIS — R05 Cough: Secondary | ICD-10-CM | POA: Insufficient documentation

## 2012-11-17 DIAGNOSIS — J40 Bronchitis, not specified as acute or chronic: Secondary | ICD-10-CM | POA: Insufficient documentation

## 2012-11-17 DIAGNOSIS — J209 Acute bronchitis, unspecified: Secondary | ICD-10-CM

## 2012-11-17 DIAGNOSIS — R059 Cough, unspecified: Secondary | ICD-10-CM | POA: Insufficient documentation

## 2012-11-17 DIAGNOSIS — R5381 Other malaise: Secondary | ICD-10-CM | POA: Insufficient documentation

## 2012-11-17 MED ORDER — HYDROCODONE-ACETAMINOPHEN 5-325 MG PO TABS
1.0000 | ORAL_TABLET | Freq: Once | ORAL | Status: AC
  Administered 2012-11-17: 1 via ORAL
  Filled 2012-11-17: qty 1

## 2012-11-17 MED ORDER — HYDROCODONE-ACETAMINOPHEN 5-325 MG PO TABS
1.0000 | ORAL_TABLET | ORAL | Status: AC | PRN
Start: 2012-11-17 — End: 2012-11-27

## 2012-11-17 MED ORDER — AZITHROMYCIN 250 MG PO TABS: 250.0000 mg | ORAL_TABLET | Freq: Every day | ORAL | Status: DC

## 2012-11-17 MED ORDER — BENZONATATE 100 MG PO CAPS
200.0000 mg | ORAL_CAPSULE | Freq: Once | ORAL | Status: AC
  Administered 2012-11-17: 200 mg via ORAL
  Filled 2012-11-17: qty 2

## 2012-11-17 NOTE — ED Notes (Signed)
Sob and body aches, feels tired.

## 2012-11-18 NOTE — ED Provider Notes (Signed)
History     CSN: [id number]  Arrival date & time 11/17/12  1448   First MD Initiated Contact with Patient 11/17/12 1640      Chief Complaint  Patient presents with  . Generalized Body Aches    (Consider location/radiation/quality/duration/timing/severity/associated sxs/prior treatment) HPI Comments: Olivia Manning presents with persistent cough which is sometimes productive of yellow to brown sputum, generalized body aches and fatigue along with intermittent episodes of wheezing and shortness of breath. Her symptoms have been present for the past several weeks,  Last seen here for this complaint on 10/31/12 at which time she had a normal xray.  She has not attempted to see her pcp for this complaint. She does have copd and has a home nebulizer,  Her last dose of albuterol taken just prior to arrival here.  She also has home oxygen use prn,  Stating she mostly uses it at night only.  She has had no documented fevers but does feel chilled.  She denies chest pain and peripheral edema.  She continues to smoke.  The history is provided by the patient.    Past Medical History  Diagnosis Date  . Stroke   . H/O angioplasty   . COPD (chronic obstructive pulmonary disease)     Past Surgical History  Procedure Date  . Abdominal hysterectomy   . Cholecystectomy   . Tubal ligation   . Knee arthroscopy   . Middle ear surgery     Family History  Problem Relation Age of Onset  . Heart attack Father   . Heart failure Father   . Hypertension Father   . Heart attack Brother     History  Substance Use Topics  . Smoking status: Current Every Day Smoker -- 0.2 packs/day  . Smokeless tobacco: Former Neurosurgeon    Quit date: 02/06/2012  . Alcohol Use: Yes     Comment: occ    OB History    Grav Para Term Preterm Abortions TAB SAB Ect Mult Living                  Review of Systems  Constitutional: Positive for chills and fatigue. Negative for fever.  HENT: Negative for congestion, sore  throat and neck pain.   Eyes: Negative.   Respiratory: Positive for cough, shortness of breath and wheezing. Negative for chest tightness.   Cardiovascular: Negative for chest pain, palpitations and leg swelling.  Gastrointestinal: Negative for nausea and abdominal pain.  Genitourinary: Negative.   Musculoskeletal: Negative for joint swelling and arthralgias.  Skin: Negative.  Negative for rash and wound.  Neurological: Negative for dizziness, weakness, light-headedness, numbness and headaches.  Hematological: Negative.   Psychiatric/Behavioral: Negative.     Allergies  Codeine and Adhesive  Home Medications   Current Outpatient Rx  Name  Route  Sig  Dispense  Refill  . ALBUTEROL SULFATE (2.5 MG/3ML) 0.083% IN NEBU   Nebulization   Take 2.5 mg by nebulization 3 (three) times daily. For shortness of breath         . ALPRAZOLAM 1 MG PO TABS   Oral   Take 1 mg by mouth 4 (four) times daily.         . AZITHROMYCIN 250 MG PO TABS   Oral   Take 1 tablet (250 mg total) by mouth daily. Take first 2 tablets together, then 1 every day until finished.   6 tablet   0   . HYDROCODONE-ACETAMINOPHEN 5-325 MG PO TABS   Oral  Take 1 tablet by mouth every 4 (four) hours as needed (for cough suppression).   15 tablet   0     BP 124/84  Pulse 93  Temp 98 F (36.7 C) (Oral)  Resp 20  Ht 5\' 4"  (1.626 m)  Wt 155 lb (70.308 kg)  BMI 26.61 kg/m2  SpO2 98%  Physical Exam  Nursing note and vitals reviewed. Constitutional: She appears well-developed and well-nourished.  HENT:  Head: Normocephalic and atraumatic.  Eyes: Conjunctivae normal are normal.  Neck: Normal range of motion.  Cardiovascular: Normal rate, regular rhythm, normal heart sounds and intact distal pulses.   Pulmonary/Chest: Effort normal and breath sounds normal. She has no wheezes. She has no rhonchi. She has no rales.  Abdominal: Soft. Bowel sounds are normal. There is no tenderness.  Musculoskeletal: Normal  range of motion. She exhibits no edema and no tenderness.  Neurological: She is alert.  Skin: Skin is warm and dry.  Psychiatric: She has a normal mood and affect.    ED Course  Procedures (including critical care time)  Labs Reviewed - No data to display Dg Chest 2 View  11/17/2012  *RADIOLOGY REPORT*  Clinical Data: Cough, fever  CHEST - 2 VIEW  Comparison: 10/31/2012  Findings: Normal heart size with stable vascular and interstitial prominence as before.  Negative for edema, pneumonia, collapse, consolidation, effusion, or pneumothorax.  Trachea is midline. Cholecystectomy clips noted.  IMPRESSION: Stable exam.  No superimposed acute process   Original Report Authenticated By: Judie Petit. Shick, M.D.      1. Bronchitis with bronchospasm       MDM  Pt with known copd with continued persistent sx suggesting acute on chronic bronchitis.  Will tx with z pack,  Encouraged to continue using home neb tx, none given here as no active wheezing during visit.  Hydrocodone prescribed for cough suppression.  Pt is scheduled to see pcp in 2-3 weeks, in interim,  Encouraged closer f/u if sx worsen.  Advised smoking cessation.        Burgess Amor, [place] 11/18/12 1353

## 2012-11-21 NOTE — ED Provider Notes (Signed)
Medical screening examination/treatment/procedure(s) were performed by non-physician practitioner and as supervising physician I was immediately available for consultation/collaboration.  Donnetta Hutching, MD 11/21/12 [phone]

## 2013-04-04 ENCOUNTER — Emergency Department (HOSPITAL_COMMUNITY)
Admission: EM | Admit: 2013-04-04 | Discharge: 2013-04-04 | Disposition: A | Discharge status: Home / Self Care | Payer: Medicare Other | Attending: Emergency Medicine | Admitting: Emergency Medicine

## 2013-04-04 ENCOUNTER — Emergency Department (HOSPITAL_COMMUNITY): Payer: Medicare Other

## 2013-04-04 ENCOUNTER — Encounter (HOSPITAL_COMMUNITY): Payer: Self-pay

## 2013-04-04 DIAGNOSIS — Y9301 Activity, walking, marching and hiking: Secondary | ICD-10-CM | POA: Insufficient documentation

## 2013-04-04 DIAGNOSIS — J4489 Other specified chronic obstructive pulmonary disease: Secondary | ICD-10-CM | POA: Insufficient documentation

## 2013-04-04 DIAGNOSIS — F17200 Nicotine dependence, unspecified, uncomplicated: Secondary | ICD-10-CM | POA: Insufficient documentation

## 2013-04-04 DIAGNOSIS — S90829A Blister (nonthermal), unspecified foot, initial encounter: Secondary | ICD-10-CM

## 2013-04-04 DIAGNOSIS — M25469 Effusion, unspecified knee: Secondary | ICD-10-CM | POA: Insufficient documentation

## 2013-04-04 DIAGNOSIS — Z8673 Personal history of transient ischemic attack (TIA), and cerebral infarction without residual deficits: Secondary | ICD-10-CM | POA: Insufficient documentation

## 2013-04-04 DIAGNOSIS — J449 Chronic obstructive pulmonary disease, unspecified: Secondary | ICD-10-CM | POA: Insufficient documentation

## 2013-04-04 DIAGNOSIS — IMO0002 Reserved for concepts with insufficient information to code with codable children: Secondary | ICD-10-CM | POA: Insufficient documentation

## 2013-04-04 DIAGNOSIS — X58XXXA Exposure to other specified factors, initial encounter: Secondary | ICD-10-CM | POA: Insufficient documentation

## 2013-04-04 DIAGNOSIS — Z23 Encounter for immunization: Secondary | ICD-10-CM | POA: Insufficient documentation

## 2013-04-04 DIAGNOSIS — F410 Panic disorder [episodic paroxysmal anxiety] without agoraphobia: Secondary | ICD-10-CM | POA: Insufficient documentation

## 2013-04-04 DIAGNOSIS — M25462 Effusion, left knee: Secondary | ICD-10-CM

## 2013-04-04 DIAGNOSIS — Y929 Unspecified place or not applicable: Secondary | ICD-10-CM | POA: Insufficient documentation

## 2013-04-04 DIAGNOSIS — Z79899 Other long term (current) drug therapy: Secondary | ICD-10-CM | POA: Insufficient documentation

## 2013-04-04 DIAGNOSIS — Z9861 Coronary angioplasty status: Secondary | ICD-10-CM | POA: Insufficient documentation

## 2013-04-04 HISTORY — DX: Anxiety disorder, unspecified: F41.9

## 2013-04-04 HISTORY — DX: Panic disorder (episodic paroxysmal anxiety): F41.0

## 2013-04-04 MED ORDER — IBUPROFEN 600 MG PO TABS: 600.0000 mg | ORAL_TABLET | Freq: Four times a day (QID) | ORAL | Status: AC | PRN

## 2013-04-04 MED ORDER — HYDROCODONE-ACETAMINOPHEN 5-325 MG PO TABS: 1.0000 | ORAL_TABLET | ORAL | Status: DC | PRN

## 2013-04-04 MED ORDER — TETANUS-DIPHTH-ACELL PERTUSSIS 5-2.5-18.5 LF-MCG/0.5 IM SUSP
0.5000 mL | Freq: Once | INTRAMUSCULAR | Status: AC
  Administered 2013-04-04: 0.5 mL via INTRAMUSCULAR
  Filled 2013-04-04: qty 0.5

## 2013-04-04 NOTE — ED Notes (Signed)
1. Left knee pain for 1 week.  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

## 2013-04-04 NOTE — ED Provider Notes (Signed)
History     CSN: [id number]  Arrival date & time 04/04/13  1418   First MD Initiated Contact with Patient 04/04/13 1441      Chief Complaint  Patient presents with  . Knee Pain    (Consider location/radiation/quality/duration/timing/severity/associated sxs/prior treatment) HPI Comments: Olivia Manning is a 52 y.o. Female presenting with left knee pain.  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  She has a history of an act repair of this knee and feels there must be a problem with the hardware as there is increased popping and clicking in the knee and she has problems with flexion.  She also reports blisters on her feet which are also sore from walking.  She is not utd on her tetanus.  She has taken no medicines prior to arrival here. Weight bearing makes symptoms worse and is better at rest.     The history is provided by the patient.    Past Medical History  Diagnosis Date  . Stroke   . H/O angioplasty   . COPD (chronic obstructive pulmonary disease)   . Anxiety   . Panic attacks     Past Surgical History  Procedure Laterality Date  . Abdominal hysterectomy    . Cholecystectomy    . Tubal ligation    . Knee arthroscopy    . Middle ear surgery      Family History  Problem Relation Age of Onset  . Heart attack Father   . Heart failure Father   . Hypertension Father   . Heart attack Brother     History  Substance Use Topics  . Smoking status: Current Every Day Smoker -- 0.25 packs/day    Types: Cigarettes  . Smokeless tobacco: Former Neurosurgeon    Quit date: 02/06/2012  . Alcohol Use: Yes     Comment: occ    OB History   Grav Para Term Preterm Abortions TAB SAB Ect Mult Living                  Review of Systems  Constitutional: Negative for fever.  Musculoskeletal: Positive for joint swelling and arthralgias. Negative for myalgias.  Skin: Positive for wound.  Neurological: Negative for weakness  and numbness.    Allergies  Codeine and Adhesive  Home Medications   Current Outpatient Rx  Name  Route  Sig  Dispense  Refill  . albuterol (PROVENTIL) (2.5 MG/3ML) 0.083% nebulizer solution   Nebulization   Take 2.5 mg by nebulization 3 (three) times daily.         Marland Kitchen ALPRAZolam (XANAX) 1 MG tablet   Oral   Take 1 mg by mouth 4 (four) times daily.         Marland Kitchen HYDROcodone-acetaminophen (NORCO/VICODIN) 5-325 MG per tablet   Oral   Take 1 tablet by mouth every 6 (six) hours as needed for pain.         Marland Kitchen ipratropium (ATROVENT) 0.02 % nebulizer solution   Nebulization   Take 500 mcg by nebulization at bedtime.         . nitroGLYCERIN (NITROSTAT) 0.4 MG SL tablet   Sublingual   Place 0.4 mg under the tongue every 5 (five) minutes x 3 doses as needed for chest pain.         Marland Kitchen HYDROcodone-acetaminophen (NORCO/VICODIN) 5-325 MG per tablet   Oral   Take 1 tablet by mouth every 4 (four) hours as needed  for pain.   20 tablet   0   . ibuprofen (ADVIL,MOTRIN) 600 MG tablet   Oral   Take 1 tablet (600 mg total) by mouth every 6 (six) hours as needed for pain.   20 tablet   0     BP 125/94  Pulse 81  Temp(Src) 97.3 F (36.3 C) (Oral)  Resp 19  Ht 5\' 4"  (1.626 m)  Wt 155 lb (70.308 kg)  BMI 26.59 kg/m2  SpO2 98%  Physical Exam  Constitutional: She appears well-developed and well-nourished.  HENT:  Head: Atraumatic.  Neck: Normal range of motion.  Cardiovascular:  Pulses equal bilaterally  Musculoskeletal: She exhibits edema and tenderness.       Left knee: She exhibits effusion. She exhibits no ecchymosis, no deformity, no erythema, normal alignment, no LCL laxity, normal patellar mobility and no MCL laxity. Tenderness found. Medial joint line tenderness noted.  Neurological: She is alert. She has normal strength. She displays normal reflexes. No sensory deficit.  Equal strength  Skin: Skin is warm and dry.  Several small blisters on bilateral small toes with  sloughed outer layer.  No drainage.  No surrounding erythema, induration or red streaking.    Psychiatric: She has a normal mood and affect.    ED Course  Procedures (including critical care time)  Labs Reviewed - No data to display Dg Knee Complete 4 Views Left  04/04/2013  *RADIOLOGY REPORT*  Clinical Data: Left knee pain.  LEFT KNEE - COMPLETE 4+ VIEW  Comparison: None  Findings: Moderate tricompartmental degenerative changes, advance for age.  Surgical changes from ACL reconstruction are noted.  No acute bony findings.  There is a moderate sized suprapatellar knee joint effusion.  IMPRESSION:  1.  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 2.  No acute bony findings. 3.  Moderate sized joint effusion.   Original Report Authenticated By: Rudie Meyer, M.D.      1. Knee effusion, left   2. Friction blister of the foot, unspecified laterality, initial encounter       MDM  Patients labs and/or radiological studies were viewed and considered during the medical decision making and disposition process.  Pt prescribed hydrocodone, ibuprofen,  RICE, knee immobilizer, crutches.  Referral to ortho for further management.         Burgess Amor, PA-C 04/04/13 1705

## 2013-04-06 NOTE — ED Provider Notes (Signed)
Medical screening examination/treatment/procedure(s) were performed by non-physician practitioner and as supervising physician I was immediately available for consultation/collaboration.    Laray Anger, DO 04/06/13 1300

## 2013-04-21 ENCOUNTER — Encounter: Payer: Self-pay | Admitting: Orthopedic Surgery

## 2013-04-21 ENCOUNTER — Ambulatory Visit (INDEPENDENT_AMBULATORY_CARE_PROVIDER_SITE_OTHER): Payer: Medicare Other | Admitting: Orthopedic Surgery

## 2013-04-21 VITALS — BP 102/66 | Ht 64.0 in | Wt 163.0 lb

## 2013-04-21 DIAGNOSIS — M23322 Other meniscus derangements, posterior horn of medial meniscus, left knee: Secondary | ICD-10-CM

## 2013-04-21 DIAGNOSIS — M23329 Other meniscus derangements, posterior horn of medial meniscus, unspecified knee: Secondary | ICD-10-CM

## 2013-04-21 MED ORDER — HYDROCODONE-ACETAMINOPHEN 5-325 MG PO TABS: 1.0000 | ORAL_TABLET | ORAL | Status: DC | PRN

## 2013-04-21 NOTE — Progress Notes (Signed)
Patient ID: Olivia Manning, female   DOB: 01/19/1961, 52 y.o.   MRN: [id number] Chief Complaint  Patient presents with  . Knee Pain    Left knee pain, gives out when walking    Date of onset of symptoms 04/04/2013  Patient history this is a 52 year old female status post left CVA with minimal right-sided residual physical fracture. 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 She developed sharp dull throbbing stabbing burning 10 out of 10 constant knee pain associated with locking and catching as well as swelling. She rates her pain a 10  She has a history of previous anterior cruciate ligament tear  She's on ibuprofen currently. She did take some Vicodin/hydrocodone which seemed to help some as well. She reports numbness and tingling in the same leg.  She has a history of weight gain and redness of the eyes chest pain, shortness of breath with wheezing; nausea; numbness, dizziness; nervousness, anxiety, depression and history of hallucination. GU, skin, hematologic, endocrine and allergic systems were negative and musculoskeletal systems as noted above  Past Medical History  Diagnosis Date  . Stroke   . H/O angioplasty   . COPD (chronic obstructive pulmonary disease)   . Anxiety   . Panic attacks     Past Surgical History  Procedure Laterality Date  . Abdominal hysterectomy    . Cholecystectomy    . Tubal ligation    . Knee arthroscopy    . Middle ear surgery       Exam BP 102/66  Ht 5\' 4"  (1.626 m)  Wt 163 lb (73.936 kg)  BMI 27.97 kg/m2 Overall appearance is normal. She is oriented x3 her mood and affect are normal  She walked in with the knee immobilizer but I advised her to remove it and she was able to limp out without it. The right and left upper extremity revealed no abnormalities on inspection, range of motion is not restricted and there no contractures. The joints are reduced without subluxation in  the muscle tone is normal. No skin rash is noted.  The right knee shows some mild laxity but full range of motion there's no tenderness the knee is normal in terms of strength and the skin is normal.  The left knee shows mild AP laxity I could not do a pivot shift because of the pain she has severe medial joint line tenderness with a small joint effusion the range of motion was limited to 100 of flexion. Muscle tone was normal skin was intact.  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

## 2013-04-21 NOTE — Patient Instructions (Addendum)
Remove brace   Continue ibuprofen  Resume hydrocodone   MRI left knee

## 2013-04-22 ENCOUNTER — Other Ambulatory Visit: Payer: Self-pay | Admitting: Radiology

## 2013-04-22 DIAGNOSIS — S83207A Unspecified tear of unspecified meniscus, current injury, left knee, initial encounter: Secondary | ICD-10-CM

## 2013-04-30 ENCOUNTER — Telehealth: Payer: Self-pay | Admitting: Orthopedic Surgery

## 2013-04-30 NOTE — Telephone Encounter (Signed)
Patient called back inquiring about Mri, and whether we have been able to schedule it.  As discussed, patient has not provided the correct, verifiable secondary insurance card [facility] [employer]); primary is [facility] [employer].  She is in process of changing primary care provider, therefore, said she may not have the updated card until June 1st.  Aware MRI is on hold until card is received and verified.

## 2013-05-19 ENCOUNTER — Other Ambulatory Visit: Payer: Self-pay | Admitting: *Deleted

## 2013-05-19 DIAGNOSIS — M23322 Other meniscus derangements, posterior horn of medial meniscus, left knee: Secondary | ICD-10-CM

## 2013-05-19 MED ORDER — HYDROCODONE-ACETAMINOPHEN 5-325 MG PO TABS: 1.0000 | ORAL_TABLET | ORAL | Status: DC | PRN

## 2013-06-11 ENCOUNTER — Ambulatory Visit (HOSPITAL_COMMUNITY): Payer: PRIVATE HEALTH INSURANCE

## 2013-06-11 ENCOUNTER — Ambulatory Visit (HOSPITAL_COMMUNITY)
Admission: RE | Admit: 2013-06-11 | Discharge: 2013-06-11 | Disposition: A | Discharge status: Home / Self Care | Payer: PRIVATE HEALTH INSURANCE | Source: Ambulatory Visit | Attending: Orthopedic Surgery | Admitting: Orthopedic Surgery

## 2013-06-11 DIAGNOSIS — M25569 Pain in unspecified knee: Secondary | ICD-10-CM | POA: Insufficient documentation

## 2013-06-11 DIAGNOSIS — S83207A Unspecified tear of unspecified meniscus, current injury, left knee, initial encounter: Secondary | ICD-10-CM

## 2013-06-11 DIAGNOSIS — R937 Abnormal findings on diagnostic imaging of other parts of musculoskeletal system: Secondary | ICD-10-CM | POA: Insufficient documentation

## 2013-06-15 NOTE — Progress Notes (Signed)
Informed patient of results per Dr. Romeo Apple advised her to keep appointment for 06/30/13

## 2013-06-16 ENCOUNTER — Other Ambulatory Visit: Payer: Self-pay | Admitting: *Deleted

## 2013-06-16 DIAGNOSIS — M23322 Other meniscus derangements, posterior horn of medial meniscus, left knee: Secondary | ICD-10-CM

## 2013-06-16 MED ORDER — HYDROCODONE-ACETAMINOPHEN 5-325 MG PO TABS: 1.0000 | ORAL_TABLET | ORAL | Status: DC | PRN

## 2013-06-17 ENCOUNTER — Telehealth: Payer: Self-pay | Admitting: *Deleted

## 2013-06-17 NOTE — Telephone Encounter (Signed)
A.J., from [place] called in regards to this patient trying to fill two different prescriptions for Norco. States that she is getting Norco 5/325 from Dr. Romeo Apple and having it filled at [facility]  and Norco 7.5/325 from a pre scriber in [place] town and having it filled at [facility]. He advised me that she had filled both on these dates: 04/02/13,04/21/13,05/12/13, and 06/02/13. He stated when she came in today to refill Dr. Mort Sawyers prescription, that she looked intoxicated and that threw up the red flag. Dr. Romeo Apple was notified, and he had me cancel his refill.

## 2013-06-30 ENCOUNTER — Ambulatory Visit: Payer: Medicare Other | Admitting: Orthopedic Surgery

## 2013-06-30 ENCOUNTER — Encounter: Payer: Self-pay | Admitting: Orthopedic Surgery

## 2013-07-25 ENCOUNTER — Emergency Department (HOSPITAL_COMMUNITY)
Admission: EM | Admit: 2013-07-25 | Discharge: 2013-07-26 | Disposition: A | Discharge status: Home / Self Care | Payer: PRIVATE HEALTH INSURANCE | Attending: Emergency Medicine | Admitting: Emergency Medicine

## 2013-07-25 ENCOUNTER — Encounter (HOSPITAL_COMMUNITY): Payer: Self-pay

## 2013-07-25 DIAGNOSIS — Z79899 Other long term (current) drug therapy: Secondary | ICD-10-CM | POA: Insufficient documentation

## 2013-07-25 DIAGNOSIS — Z8673 Personal history of transient ischemic attack (TIA), and cerebral infarction without residual deficits: Secondary | ICD-10-CM | POA: Insufficient documentation

## 2013-07-25 DIAGNOSIS — W540XXA Bitten by dog, initial encounter: Secondary | ICD-10-CM | POA: Insufficient documentation

## 2013-07-25 DIAGNOSIS — F17200 Nicotine dependence, unspecified, uncomplicated: Secondary | ICD-10-CM | POA: Insufficient documentation

## 2013-07-25 DIAGNOSIS — S91109A Unspecified open wound of unspecified toe(s) without damage to nail, initial encounter: Secondary | ICD-10-CM | POA: Insufficient documentation

## 2013-07-25 DIAGNOSIS — F410 Panic disorder [episodic paroxysmal anxiety] without agoraphobia: Secondary | ICD-10-CM | POA: Insufficient documentation

## 2013-07-25 DIAGNOSIS — L089 Local infection of the skin and subcutaneous tissue, unspecified: Secondary | ICD-10-CM

## 2013-07-25 DIAGNOSIS — Z951 Presence of aortocoronary bypass graft: Secondary | ICD-10-CM | POA: Insufficient documentation

## 2013-07-25 DIAGNOSIS — J4489 Other specified chronic obstructive pulmonary disease: Secondary | ICD-10-CM | POA: Insufficient documentation

## 2013-07-25 DIAGNOSIS — J449 Chronic obstructive pulmonary disease, unspecified: Secondary | ICD-10-CM | POA: Insufficient documentation

## 2013-07-25 DIAGNOSIS — Y929 Unspecified place or not applicable: Secondary | ICD-10-CM | POA: Insufficient documentation

## 2013-07-25 DIAGNOSIS — Y9389 Activity, other specified: Secondary | ICD-10-CM | POA: Insufficient documentation

## 2013-07-25 NOTE — ED Provider Notes (Signed)
CSN: [id number]     Arrival date & time 07/25/13  2344 History    This chart was scribed for Jones Skene, MD by Quintella Reichert, ED scribe.  This patient was seen in room APA04/APA04 and the patient's care was started at 11:58 PM.    Chief Complaint  Patient presents with  . Toe Injury    The history is provided by the patient. No language interpreter was used.    HPI Comments: Olivia Manning is a 52 y.o. female who presents to the Emergency Department complaining of a dog bite to her left great toe that occurred one month ago with subsequent pain, redness and swelling that have grown progressively more severe.  Pt states that the dog is up to date on all vaccinations and rabies shots.  Pain is described as a constant throbbing.  She also complains of a blister to the bottom of the toe that has had yellow purulent discharge   In addition she notes that the toenail has fallen off.  She has attempted to treat symptoms with ibuprofen and Neosporin ointment, without relief.  She denies fevers, chills, SOB, nausea, vomiting or any other associated symptoms.  Pt has not consulted with her PCP over her current symptom but does have an appointment in 4 days.  She notes that she stands 8 hours a day at work.   Tetanus vaccinations ae UTD.    PCP is Dr. Sudie Bailey   Past Medical History  Diagnosis Date  . Stroke   . H/O angioplasty   . COPD (chronic obstructive pulmonary disease)   . Anxiety   . Panic attacks     Past Surgical History  Procedure Laterality Date  . Abdominal hysterectomy    . Cholecystectomy    . Tubal ligation    . Knee arthroscopy    . Middle ear surgery      Family History  Problem Relation Age of Onset  . Heart attack Father   . Heart failure Father   . Hypertension Father   . Heart attack Brother     History  Substance Use Topics  . Smoking status: Current Every Day Smoker -- 0.25 packs/day    Types: Cigarettes  . Smokeless tobacco: Former Neurosurgeon    Quit  date: 02/06/2012  . Alcohol Use: No     Comment: occ    OB History   Grav Para Term Preterm Abortions TAB SAB Ect Mult Living                   Review of Systems    Allergies  Codeine and Adhesive  Home Medications   Current Outpatient Rx  Name  Route  Sig  Dispense  Refill  . albuterol (PROVENTIL) (2.5 MG/3ML) 0.083% nebulizer solution   Nebulization   Take 2.5 mg by nebulization 3 (three) times daily.         Marland Kitchen ALPRAZolam (XANAX) 1 MG tablet   Oral   Take 1 mg by mouth 4 (four) times daily.         Marland Kitchen HYDROcodone-acetaminophen (NORCO/VICODIN) 5-325 MG per tablet   Oral   Take 1 tablet by mouth every 4 (four) hours as needed for pain.   42 tablet   1   . ipratropium (ATROVENT) 0.02 % nebulizer solution   Nebulization   Take 500 mcg by nebulization at bedtime.         . nitroGLYCERIN (NITROSTAT) 0.4 MG SL tablet   Sublingual  Place 0.4 mg under the tongue every 5 (five) minutes x 3 doses as needed for chest pain.         Marland Kitchen ibuprofen (ADVIL,MOTRIN) 600 MG tablet   Oral   Take 1 tablet (600 mg total) by mouth every 6 (six) hours as needed for pain.   20 tablet   0    BP 136/81  Pulse 74  Temp(Src) 98.7 F (37.1 C) (Oral)  Resp 19  Ht 5\' 2"  (1.575 m)  Wt 154 lb (69.854 kg)  BMI 28.16 kg/m2  SpO2 97%  Physical Exam  Nursing notes reviewed.  Electronic medical record reviewed. VITAL SIGNS:   Filed Vitals:   07/25/13 2354 07/26/13 0007  BP: 136/81 151/87  Pulse: 74 71  Temp: 98.7 F (37.1 C)   TempSrc: Oral   Resp: 19 18  Height: 5\' 2"  (1.575 m)   Weight: 154 lb (69.854 kg)   SpO2: 97% 98%   CONSTITUTIONAL: Awake, oriented, appears non-toxic HENT: Atraumatic, normocephalic, oral mucosa pink and moist, airway patent. Nares patent without drainage. External ears normal. EYES: Conjunctiva clear, EOMI, PERRLA NECK: Trachea midline, non-tender, supple CARDIOVASCULAR: Normal heart rate, Normal rhythm, No murmurs, rubs,  gallops PULMONARY/CHEST: Clear to auscultation, no rhonchi, wheezes, or rales. Symmetrical breath sounds. Non-tender. ABDOMINAL: Non-distended, soft, non-tender - no rebound or guarding.  BS normal. NEUROLOGIC: Non-focal, moving all four extremities, no gross sensory or motor deficits. EXTREMITIES: No clubbing, cyanosis, or edema.  Left great toe with mild erythema distally, TTP, does not cross IP joint.  No streaking, no drainage.  Evidence of healed blistering. SKIN: Warm, Dry, No erythema, No rash  ED Course  Procedures (including critical care time)  DIAGNOSTIC STUDIES: Oxygen Saturation is 97% on room air, normal by my interpretation.     Labs Reviewed - No data to display Dg Foot Complete Left  07/26/2013   *RADIOLOGY REPORT*  Clinical Data: Persistent left great toe pain, swelling and erythema after bitten by dog one month ago.  LEFT FOOT - COMPLETE 3+ VIEW  Comparison: None.  Findings: No osseous erosion is seen to suggest osteomyelitis. Known soft tissue swelling is difficult to fully characterize on radiograph.  No radiopaque foreign bodies are identified.  There is no evidence of fracture or dislocation.  The joint spaces are preserved.  There is no evidence of talar subluxation; the subtalar joint is unremarkable in appearance.  A small plantar calcaneal spur is noted.  No significant soft tissue abnormalities are seen.  IMPRESSION: No osseous erosions seen to suggest osteomyelitis.  No radiopaque foreign bodies seen.  No evidence of fracture or dislocation.   Original Report Authenticated By: Tonia Ghent, M.D.   1. Dog bite, initial encounter   2. Skin infection    Procedure note Digital block, indication: Great toe pain.  Change verbal consent, risks and benefits of procedure including bleeding, infection, damage to adjacent structures were described to the patient, give her no guarantees about pain relief with this or seizure she stands and agrees with the procedure.   Patient's left great toe was prepped with chlorhexidine swabs allowed to completely dry and using sterile technique the first interspace on the left foot was entered with a 25-gauge needle loaded with bupivacaine half percent without epinephrine. Approximately 1.5 cc of anesthetic was injected in the first interspace and then again on the medial aspect of the left great toe. Patient tolerated the procedure well, and got complete pain relief.  MDM  Olivia Manning is a  52 y.o. female presenting with a red painful to-it's been one month since she was dictated by a dog. The dog is vaccinated versus rabies. It is her dog.  Could be some cellulitis, no suggestion of osteomyelitis on x-ray. Will start patient on antibiotics. Patient received a digital block for pain relief and got complete pain relief.    I personally performed the services described in this documentation, which was scribed in my presence. The recorded information has been reviewed and is accurate. Jones Skene, M.D.     Jones Skene, MD 07/27/13 [phone]

## 2013-07-25 NOTE — ED Notes (Signed)
Pt had a dog bite x1 month ago , has been soaking foot, toe nail fell off, and states yellow pus has been draining, toe is throbbing, pain is radiating up back of foot.

## 2013-07-25 NOTE — ED Notes (Signed)
Pt states she was bit by a dog about a month ago to her left great toe, states it is throbbing and is red and swollen.

## 2013-07-26 ENCOUNTER — Emergency Department (HOSPITAL_COMMUNITY): Payer: PRIVATE HEALTH INSURANCE

## 2013-07-26 MED ORDER — AMOXICILLIN-POT CLAVULANATE 875-125 MG PO TABS: 1.0000 | ORAL_TABLET | Freq: Two times a day (BID) | ORAL | Status: DC

## 2013-07-26 MED ORDER — HYDROCODONE-ACETAMINOPHEN 5-325 MG PO TABS: 1.0000 | ORAL_TABLET | Freq: Four times a day (QID) | ORAL | Status: DC | PRN

## 2013-07-26 MED ORDER — AMOXICILLIN-POT CLAVULANATE 875-125 MG PO TABS
1.0000 | ORAL_TABLET | Freq: Once | ORAL | Status: AC
  Administered 2013-07-26: 1 via ORAL
  Filled 2013-07-26: qty 1

## 2013-07-26 MED ORDER — BUPIVACAINE HCL (PF) 0.5 % IJ SOLN
INTRAMUSCULAR | Status: AC
  Administered 2013-07-26: 01:00:00
  Filled 2013-07-26: qty 30

## 2013-07-26 NOTE — ED Notes (Signed)
Patient given discharge instruction, verbalized understand. Patient ambulatory out of the department.  

## 2013-07-26 NOTE — ED Notes (Signed)
MD at the bedside to do nerve block

## 2013-08-11 IMAGING — CR DG CHEST 2V
2 series · 2 of 2 positions shown · non-contrast
Comparison: 10/31/2012

CLINICAL DATA: Cough, fever

CHEST - 2 VIEW

[view not recorded (1 of 2)]
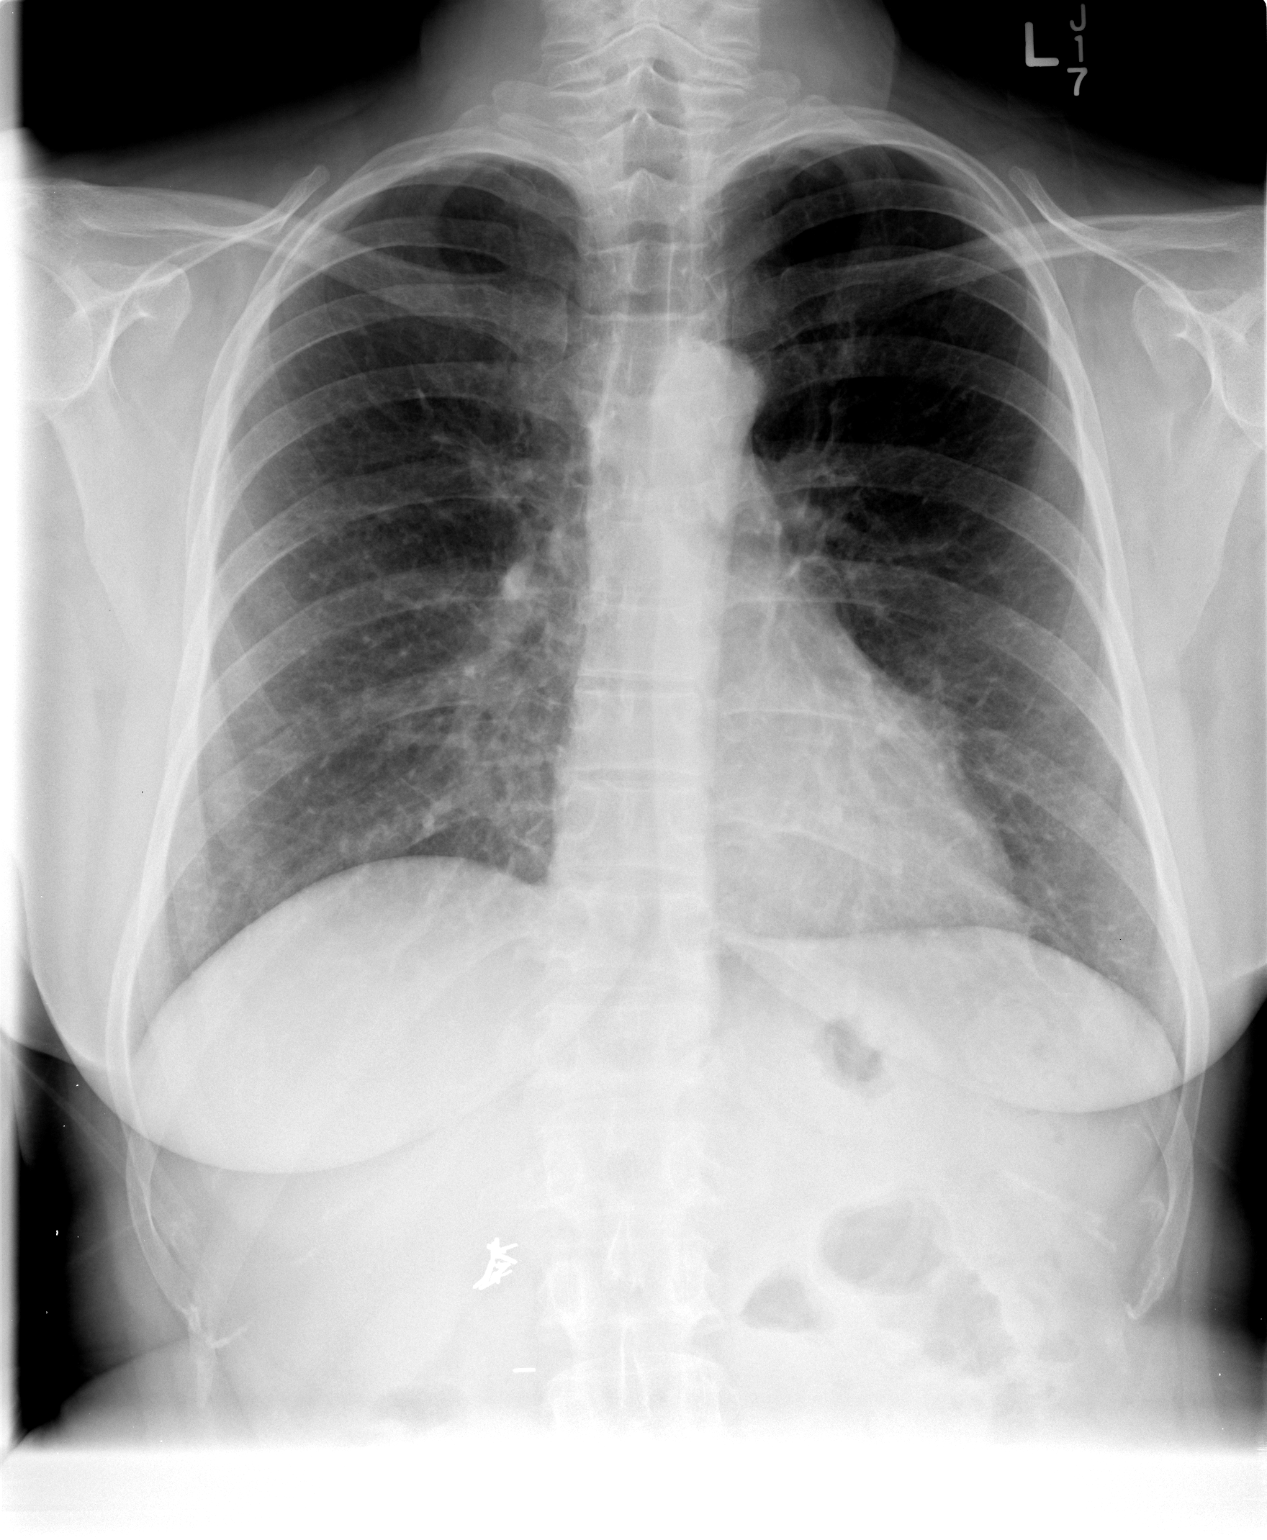

[view not recorded (2 of 2)]
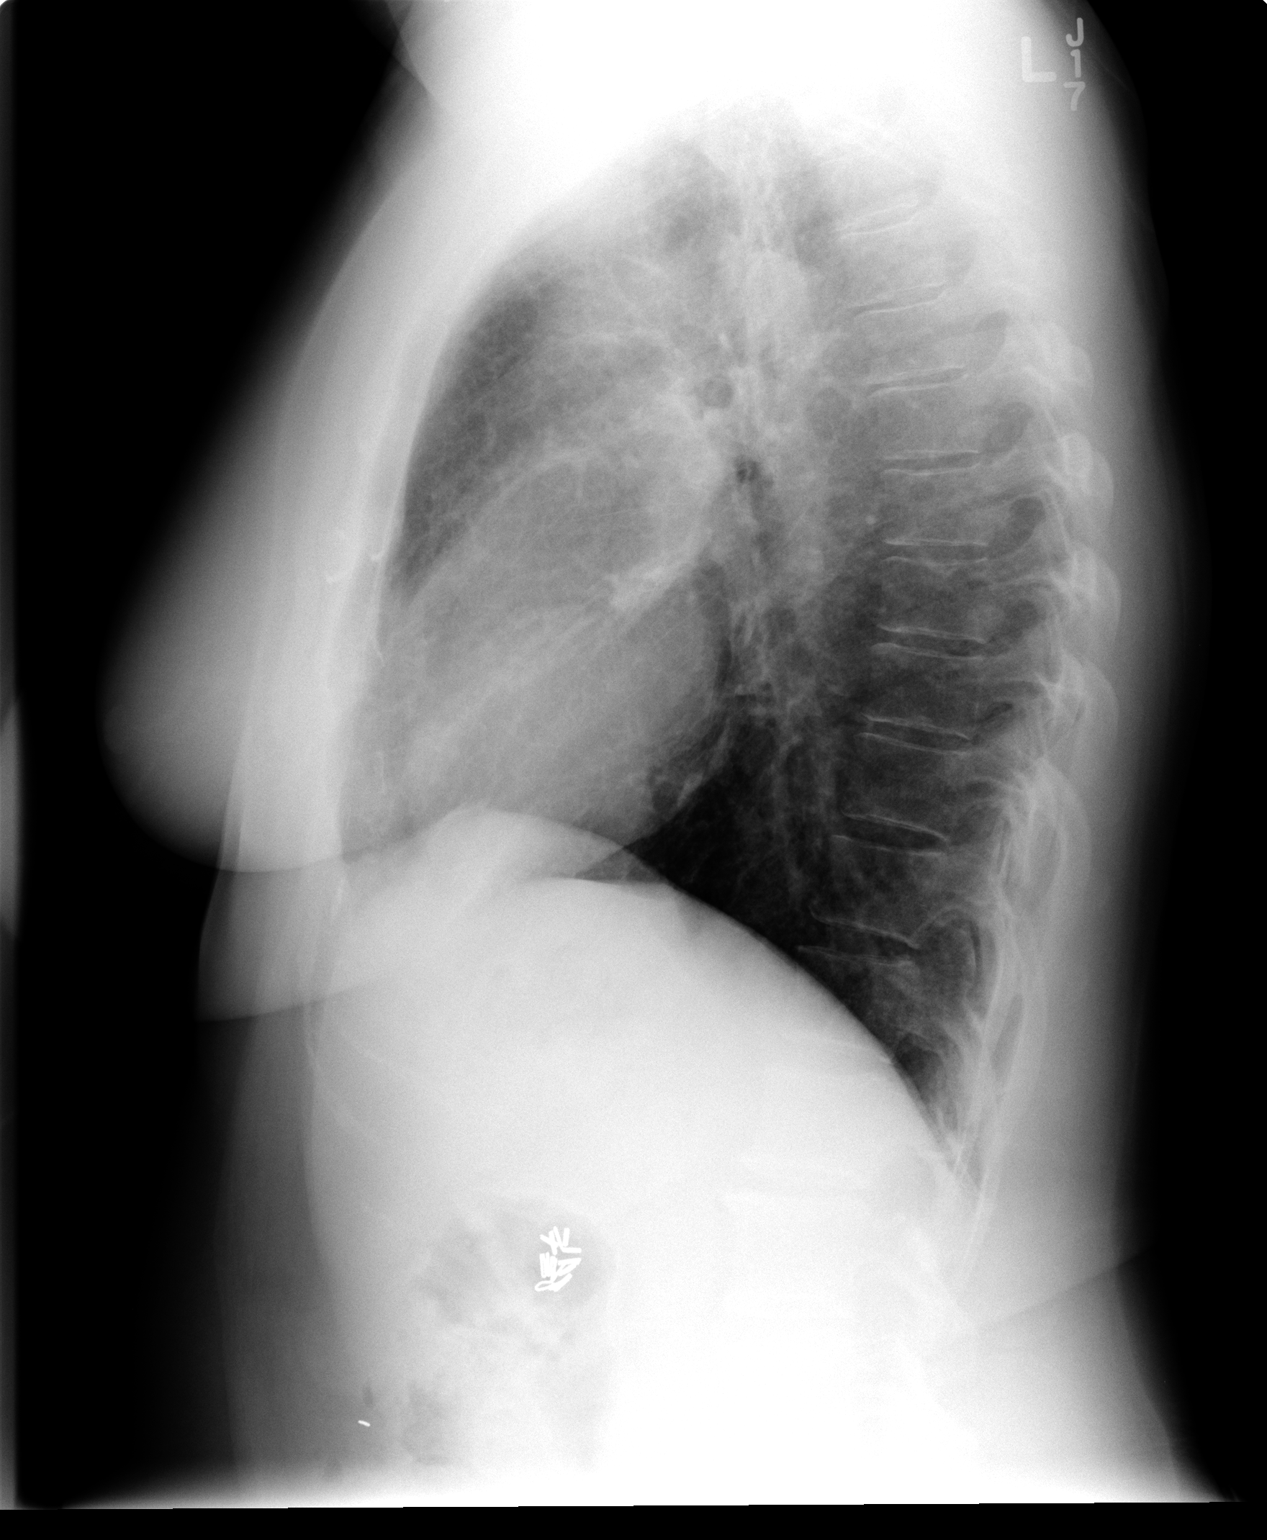

[2 of 2 positions shown; findings below may reference images not displayed]

FINDINGS: Normal heart size with stable vascular and interstitial
prominence as before.  Negative for edema, pneumonia, collapse,
consolidation, effusion, or pneumothorax.  Trachea is midline.
Cholecystectomy clips noted.
IMPRESSION: Stable exam.  No superimposed acute process

## 2013-11-09 ENCOUNTER — Emergency Department (HOSPITAL_COMMUNITY): Payer: PRIVATE HEALTH INSURANCE

## 2013-11-09 ENCOUNTER — Encounter (HOSPITAL_COMMUNITY): Payer: Self-pay | Admitting: Emergency Medicine

## 2013-11-09 ENCOUNTER — Emergency Department (HOSPITAL_COMMUNITY)
Admission: EM | Admit: 2013-11-09 | Discharge: 2013-11-09 | Disposition: A | Discharge status: Home / Self Care | Payer: PRIVATE HEALTH INSURANCE | Attending: Emergency Medicine | Admitting: Emergency Medicine

## 2013-11-09 DIAGNOSIS — Z9861 Coronary angioplasty status: Secondary | ICD-10-CM | POA: Insufficient documentation

## 2013-11-09 DIAGNOSIS — K920 Hematemesis: Secondary | ICD-10-CM | POA: Insufficient documentation

## 2013-11-09 DIAGNOSIS — R079 Chest pain, unspecified: Secondary | ICD-10-CM

## 2013-11-09 DIAGNOSIS — F410 Panic disorder [episodic paroxysmal anxiety] without agoraphobia: Secondary | ICD-10-CM | POA: Insufficient documentation

## 2013-11-09 DIAGNOSIS — J441 Chronic obstructive pulmonary disease with (acute) exacerbation: Secondary | ICD-10-CM | POA: Insufficient documentation

## 2013-11-09 DIAGNOSIS — Z8673 Personal history of transient ischemic attack (TIA), and cerebral infarction without residual deficits: Secondary | ICD-10-CM | POA: Insufficient documentation

## 2013-11-09 DIAGNOSIS — R110 Nausea: Secondary | ICD-10-CM | POA: Insufficient documentation

## 2013-11-09 DIAGNOSIS — R072 Precordial pain: Secondary | ICD-10-CM | POA: Insufficient documentation

## 2013-11-09 DIAGNOSIS — F17200 Nicotine dependence, unspecified, uncomplicated: Secondary | ICD-10-CM | POA: Insufficient documentation

## 2013-11-09 DIAGNOSIS — Z79899 Other long term (current) drug therapy: Secondary | ICD-10-CM | POA: Insufficient documentation

## 2013-11-09 LAB — CBC WITH DIFFERENTIAL/PLATELET
Basophils Relative: 0 % (ref 0–1)
Hemoglobin: 13 g/dL (ref 12.0–15.0)
Lymphs Abs: 1.6 10*3/uL (ref 0.7–4.0)
MCHC: 33.6 g/dL (ref 30.0–36.0)
Monocytes Relative: 5 % (ref 3–12)
Neutro Abs: 3.7 10*3/uL (ref 1.7–7.7)
Neutrophils Relative %: 66 % (ref 43–77)
Platelets: 223 10*3/uL (ref 150–400)
RBC: 4.31 MIL/uL (ref 3.87–5.11)

## 2013-11-09 LAB — COMPREHENSIVE METABOLIC PANEL
ALT: 19 U/L (ref 0–35)
Albumin: 3.7 g/dL (ref 3.5–5.2)
Alkaline Phosphatase: 115 U/L (ref 39–117)
BUN: 14 mg/dL (ref 6–23)
Chloride: 106 mEq/L (ref 96–112)
Potassium: 4 mEq/L (ref 3.5–5.1)
Sodium: 141 mEq/L (ref 135–145)
Total Bilirubin: 0.2 mg/dL — ABNORMAL LOW (ref 0.3–1.2)
Total Protein: 6.7 g/dL (ref 6.0–8.3)

## 2013-11-09 LAB — TROPONIN I: Troponin I: <0.3 ng/mL (ref <0.30)

## 2013-11-09 MED ORDER — PANTOPRAZOLE SODIUM 40 MG PO TBEC: 20.0000 mg | DELAYED_RELEASE_TABLET | Freq: Every day | ORAL | Status: DC

## 2013-11-09 MED ORDER — NITROGLYCERIN 0.4 MG SL SUBL
0.4000 mg | SUBLINGUAL_TABLET | SUBLINGUAL | Status: DC | PRN
  Administered 2013-11-09 (×3): 0.4 mg via SUBLINGUAL
  Filled 2013-11-09: qty 25

## 2013-11-09 MED ORDER — LORAZEPAM 2 MG/ML IJ SOLN
0.5000 mg | Freq: Once | INTRAMUSCULAR | Status: AC
  Administered 2013-11-09: 0.5 mg via INTRAVENOUS
  Filled 2013-11-09: qty 1

## 2013-11-09 MED ORDER — ASPIRIN 81 MG PO CHEW
324.0000 mg | CHEWABLE_TABLET | Freq: Once | ORAL | Status: AC
  Administered 2013-11-09: 324 mg via ORAL
  Filled 2013-11-09: qty 4

## 2013-11-09 MED ORDER — HYDROCODONE-ACETAMINOPHEN 5-325 MG PO TABS: 1.0000 | ORAL_TABLET | Freq: Four times a day (QID) | ORAL | Status: DC | PRN

## 2013-11-09 MED ORDER — PANTOPRAZOLE SODIUM 40 MG IV SOLR
40.0000 mg | Freq: Once | INTRAVENOUS | Status: AC
  Administered 2013-11-09: 40 mg via INTRAVENOUS
  Filled 2013-11-09: qty 40

## 2013-11-09 MED ORDER — HYDROMORPHONE HCL PF 1 MG/ML IJ SOLN
1.0000 mg | Freq: Once | INTRAMUSCULAR | Status: AC
  Administered 2013-11-09: 1 mg via INTRAVENOUS
  Filled 2013-11-09: qty 1

## 2013-11-09 NOTE — ED Provider Notes (Signed)
CSN: [id number]     Arrival date & time 11/09/13  1147 History  This chart was scribed for [name], MD by Bennett Scrape, ED Scribe. This patient was seen in room APA02/APA02 and the patient's care was started at 12:19 PM.  Chief Complaint  Patient presents with  . Chest Pain    Patient is a 52 y.o. female presenting with chest pain. The history is provided by the patient (pt had chest pain today but also stated recently she had vomited blood twice.  no black stools). No language interpreter was used.  Chest Pain Pain location:  Substernal area Pain quality: tightness   Pain radiates to:  Does not radiate Onset quality:  Sudden Duration:  8 hours Timing:  Constant Progression:  Worsening Context: at rest   Associated symptoms: nausea and shortness of breath   Associated symptoms: no abdominal pain, no back pain, no cough, no fatigue, no headache and not vomiting     HPI Comments: Olivia Manning is a 52 y.o. female who presents to the Emergency Department complaining of CP that woke her from sleep at 4 AM. Pt states that she woke up with sudden onset chest tightness that worsened around 10 AM at work with associated SOB and nausea. She denies taking an ASA or NTG today. She has a h/o angioplasty in 1996. She denies any recent cardiology work up in the past 5 years. Pt also has a CVA 4 years ago with no residual effects. The trigger was deemed to be stress.   PCP is Dr. Sudie Bailey  No Cards  Past Medical History  Diagnosis Date  . Stroke   . H/O angioplasty   . COPD (chronic obstructive pulmonary disease)   . Anxiety   . Panic attacks    Past Surgical History  Procedure Laterality Date  . Abdominal hysterectomy    . Cholecystectomy    . Tubal ligation    . Knee arthroscopy    . Middle ear surgery     Family History  Problem Relation Age of Onset  . Heart attack Father   . Heart failure Father   . Hypertension Father   . Heart attack Brother    History   Substance Use Topics  . Smoking status: Current Every Day Smoker -- 0.25 packs/day    Types: Cigarettes  . Smokeless tobacco: Former Neurosurgeon    Quit date: 02/06/2012  . Alcohol Use: No     Comment: occ   No OB history provided.  Review of Systems  Constitutional: Negative for appetite change and fatigue.  HENT: Negative for congestion, ear discharge and sinus pressure.   Eyes: Negative for discharge.  Respiratory: Positive for shortness of breath. Negative for cough.   Cardiovascular: Positive for chest pain.  Gastrointestinal: Positive for nausea. Negative for vomiting, abdominal pain and diarrhea.  Genitourinary: Negative for frequency and hematuria.  Musculoskeletal: Negative for back pain.  Skin: Negative for rash.  Neurological: Negative for seizures and headaches.  Psychiatric/Behavioral: Negative for hallucinations.    Allergies  Codeine and Adhesive  Home Medications   Current Outpatient Rx  Name  Route  Sig  Dispense  Refill  . albuterol (PROVENTIL) (2.5 MG/3ML) 0.083% nebulizer solution   Nebulization   Take 2.5 mg by nebulization 3 (three) times daily.         Marland Kitchen ALPRAZolam (XANAX) 1 MG tablet   Oral   Take 1 mg by mouth 4 (four) times daily.         Marland Kitchen  HYDROcodone-acetaminophen (NORCO/VICODIN) 5-325 MG per tablet   Oral   Take 1-2 tablets by mouth every 6 (six) hours as needed for pain.   7 tablet   0   . ibuprofen (ADVIL,MOTRIN) 600 MG tablet   Oral   Take 1 tablet (600 mg total) by mouth every 6 (six) hours as needed for pain.   20 tablet   0   . ipratropium (ATROVENT) 0.02 % nebulizer solution   Nebulization   Take 500 mcg by nebulization at bedtime.         . nitroGLYCERIN (NITROSTAT) 0.4 MG SL tablet   Sublingual   Place 0.4 mg under the tongue every 5 (five) minutes x 3 doses as needed for chest pain.          Triage Vitals: BP 155/80  Pulse 74  Temp(Src) 97.7 F (36.5 C) (Oral)  Resp 20  Ht 5\' 2"  (1.575 m)  Wt 138 lb (62.596  kg)  BMI 25.23 kg/m2  Physical Exam  Nursing note and vitals reviewed. Constitutional: She is oriented to person, place, and time. She appears well-developed and well-nourished.  HENT:  Head: Normocephalic and atraumatic.  Eyes: Conjunctivae and EOM are normal. No scleral icterus.  Neck: Neck supple. No thyromegaly present.  Cardiovascular: Normal rate and regular rhythm.  Exam reveals no gallop and no friction rub.   No murmur heard. Pulmonary/Chest: Effort normal and breath sounds normal. No stridor. She has no wheezes. She has no rales. She exhibits no tenderness.  Abdominal: Soft. She exhibits no distension. There is no tenderness. There is no rebound.  Musculoskeletal: Normal range of motion. She exhibits no edema.  Lymphadenopathy:    She has no cervical adenopathy.  Neurological: She is alert and oriented to person, place, and time. She exhibits normal muscle tone. Coordination normal.  Skin: Skin is warm and dry. No rash noted. No erythema.  Psychiatric: She has a normal mood and affect. Her behavior is normal.    ED Course  Procedures (including critical care time)  Medications  nitroGLYCERIN (NITROSTAT) SL tablet 0.4 mg (0.4 mg Sublingual Given 11/09/13 1254)  aspirin chewable tablet 324 mg (324 mg Oral Given 11/09/13 1229)    DIAGNOSTIC STUDIES: None performed.    COORDINATION OF CARE: 12:23 PM-Discussed treatment plan which includes medications, CXR, CBC panel, CMP and troponin with pt at bedside and pt agreed to plan.   Labs Review Labs Reviewed - No data to display Imaging Review No results found.  EKG Interpretation    Date/Time:  Monday November 09 2013 13:46:20 EST Ventricular Rate:  58 PR Interval:  156 QRS Duration: 94 QT Interval:  430 QTC Calculation: 422 R Axis:   61 Text Interpretation:  Sinus bradycardia Cannot rule out Anterior infarct (cited on or before 09-Nov-2013) Abnormal ECG When compared with ECG of 09-Nov-2013 11:51, No significant  change was found Confirmed by Arelis Neumeier  MD, Jayanna Kroeger ([id number]) on 11/09/2013 3:40:00 PM            MDM  Chest pain and vomiting blood with nl studies.  Will tx with protonix and follow up with pcp in 1-2 days  [name], MD 11/09/13 1540

## 2013-11-09 NOTE — ED Notes (Signed)
Onset this am with chest pain, sob, nausea, no vomiting.  Alert,

## 2020-02-15 DIAGNOSIS — M9901 Segmental and somatic dysfunction of cervical region: Secondary | ICD-10-CM | POA: Diagnosis not present

## 2020-02-15 DIAGNOSIS — M9902 Segmental and somatic dysfunction of thoracic region: Secondary | ICD-10-CM | POA: Diagnosis not present

## 2020-02-15 DIAGNOSIS — M9903 Segmental and somatic dysfunction of lumbar region: Secondary | ICD-10-CM | POA: Diagnosis not present

## 2020-02-15 DIAGNOSIS — M5137 Other intervertebral disc degeneration, lumbosacral region: Secondary | ICD-10-CM | POA: Diagnosis not present

## 2020-02-16 ENCOUNTER — Encounter (HOSPITAL_COMMUNITY): Payer: Self-pay | Admitting: Emergency Medicine

## 2020-02-16 ENCOUNTER — Emergency Department (HOSPITAL_COMMUNITY)
Admission: EM | Admit: 2020-02-16 | Discharge: 2020-02-16 | Disposition: A | Discharge status: Home / Self Care | Payer: Commercial Managed Care - HMO | Attending: Emergency Medicine | Admitting: Emergency Medicine

## 2020-02-16 ENCOUNTER — Other Ambulatory Visit: Payer: Self-pay

## 2020-02-16 DIAGNOSIS — Z87891 Personal history of nicotine dependence: Secondary | ICD-10-CM | POA: Diagnosis not present

## 2020-02-16 DIAGNOSIS — J449 Chronic obstructive pulmonary disease, unspecified: Secondary | ICD-10-CM | POA: Insufficient documentation

## 2020-02-16 DIAGNOSIS — Z9049 Acquired absence of other specified parts of digestive tract: Secondary | ICD-10-CM | POA: Diagnosis not present

## 2020-02-16 DIAGNOSIS — R030 Elevated blood-pressure reading, without diagnosis of hypertension: Secondary | ICD-10-CM

## 2020-02-16 DIAGNOSIS — I10 Essential (primary) hypertension: Secondary | ICD-10-CM | POA: Insufficient documentation

## 2020-02-16 DIAGNOSIS — Z8673 Personal history of transient ischemic attack (TIA), and cerebral infarction without residual deficits: Secondary | ICD-10-CM | POA: Insufficient documentation

## 2020-02-16 DIAGNOSIS — Z79899 Other long term (current) drug therapy: Secondary | ICD-10-CM | POA: Diagnosis not present

## 2020-02-16 HISTORY — DX: Essential (primary) hypertension: I10

## 2020-02-16 LAB — CBC WITH DIFFERENTIAL/PLATELET
Abs Immature Granulocytes: 0.02 10*3/uL (ref 0.00–0.07)
Basophils Absolute: 0 10*3/uL (ref 0.0–0.1)
Basophils Relative: 0 %
Eosinophils Absolute: 0.1 10*3/uL (ref 0.0–0.5)
Eosinophils Relative: 2 %
HCT: 41.9 % (ref 36.0–46.0)
Hemoglobin: 13.7 g/dL (ref 12.0–15.0)
Immature Granulocytes: 0 %
Lymphocytes Relative: 46 %
Lymphs Abs: 3.3 10*3/uL (ref 0.7–4.0)
MCH: 30 pg (ref 26.0–34.0)
MCHC: 32.7 g/dL (ref 30.0–36.0)
MCV: 91.9 fL (ref 80.0–100.0)
Monocytes Absolute: 0.5 10*3/uL (ref 0.1–1.0)
Monocytes Relative: 7 %
Neutro Abs: 3.2 10*3/uL (ref 1.7–7.7)
Neutrophils Relative %: 45 %
Platelets: 252 10*3/uL (ref 150–400)
RBC: 4.56 MIL/uL (ref 3.87–5.11)
RDW: 13.3 % (ref 11.5–15.5)
WBC: 7.2 10*3/uL (ref 4.0–10.5)
nRBC: 0 % (ref 0.0–0.2)

## 2020-02-16 LAB — BASIC METABOLIC PANEL
Anion gap: 6 (ref 5–15)
BUN: 22 mg/dL — ABNORMAL HIGH (ref 6–20)
CO2: 27 mmol/L (ref 22–32)
Calcium: 9.1 mg/dL (ref 8.9–10.3)
Chloride: 106 mmol/L (ref 98–111)
Creatinine, Ser: 0.89 mg/dL (ref 0.44–1.00)
GFR calc Af Amer: >60 mL/min (ref >60)
GFR calc non Af Amer: >60 mL/min (ref >60)
Glucose, Bld: 85 mg/dL (ref 70–99)
Potassium: 4.3 mmol/L (ref 3.5–5.1)
Sodium: 139 mmol/L (ref 135–145)

## 2020-02-16 LAB — URINALYSIS, ROUTINE W REFLEX MICROSCOPIC
Bacteria, UA: NONE SEEN
Bilirubin Urine: NEGATIVE
Glucose, UA: NEGATIVE mg/dL
Ketones, ur: NEGATIVE mg/dL
Leukocytes,Ua: NEGATIVE
Nitrite: NEGATIVE
Protein, ur: NEGATIVE mg/dL
Specific Gravity, Urine: 1.017 (ref 1.005–1.030)
pH: 5 (ref 5.0–8.0)

## 2020-02-16 MED ORDER — ACETAMINOPHEN 325 MG PO TABS
650.0000 mg | ORAL_TABLET | Freq: Once | ORAL | Status: AC
  Administered 2020-02-16: 650 mg via ORAL
  Filled 2020-02-16: qty 2

## 2020-02-16 MED ORDER — AMLODIPINE BESYLATE 5 MG PO TABS: 5.0000 mg | ORAL_TABLET | Freq: Once | ORAL | Status: DC

## 2020-02-16 NOTE — ED Provider Notes (Signed)
[facility] EMERGENCY DEPARTMENT Provider Note   CSN: [id number] Arrival date & time: 02/16/20  1758     History Chief Complaint  Patient presents with  . Hypertension    Olivia Manning is a 59 y.o. female with past medical history significant for anxiety, COPD, hypertension, CVA presents to emergency department today with chief complaint of hypertension.  Patient states she was at her dentist office to have a tooth extracted when they checked her blood pressure and found it to be 178 systolic, she is unsure of the diastolic number.  She did not have a dental seizure performed and instead went home where she checked her blood pressure for more times.  She reports systolic readings of 158-181, again unsure of the diastolic number.  She states she has had a mild headache over the last week that feels like headache she has had in the past.  Pain is located in the back of her head and she rates the pain 7 out of 10 in severity.  She took Motrin which improved headache.  She denies any associated neck pain.  She called her PCP and schedule an appointment for tomorrow to further discuss hypertension.  Patient states she has not been on any daily medications for the last 5 to 10 years.  She stopped taking them because she did not like how they made her feel.  She is not anticoagulated. Denies fever, syncope, head trauma, photophobia, phonophobia, UL throbbing, N/V, visual changes, stiff neck, neck pain, rash, or "thunderclap" onset.  Also denies chest pain, shortness of breath, dizziness, numbness, weakness, syncope, palpitations.   History provided by patient with additional history obtained from chart review.    Past Medical History:  Diagnosis Date  . Anxiety   . COPD (chronic obstructive pulmonary disease) (HCC)   . H/O angioplasty   . Hypertension   . Panic attacks   . Stroke [facility])     Patient Active Problem List   Diagnosis Date Noted  . Medial meniscus, posterior horn derangement  04/21/2013  . Chest pain 02/06/2012  . Tobacco abuse 02/06/2012  . History of stroke 02/06/2012  . Chronic back pain 02/06/2012  . COPD (chronic obstructive pulmonary disease) (HCC) 02/06/2012    Past Surgical History:  Procedure Laterality Date  . ABDOMINAL HYSTERECTOMY    . CHOLECYSTECTOMY    . KNEE ARTHROSCOPY    . MIDDLE EAR SURGERY    . TUBAL LIGATION       OB History   No obstetric history on file.     Family History  Problem Relation Age of Onset  . Heart attack Father   . Heart failure Father   . Hypertension Father   . Heart attack Brother     Social History   Tobacco Use  . Smoking status: Former Smoker    Packs/day: 0.25    Types: Cigarettes    Quit date: 01/29/2020    Years since quitting: 0.0  . Smokeless tobacco: Former Neurosurgeon    Quit date: 02/06/2012  Substance Use Topics  . Alcohol use: No    Comment: occ  . Drug use: Not Currently    Types: Marijuana    Home Medications Prior to Admission medications   Medication Sig Start Date End Date Taking? Authorizing Provider  clindamycin (CLEOCIN) 300 MG capsule Take by mouth. 02/09/20 02/19/20 Yes [provider]  ondansetron (ZOFRAN) 4 MG tablet Take by mouth. 01/29/20 01/28/21 Yes [provider]  albuterol (PROVENTIL) (2.5 MG/3ML) 0.083%  nebulizer solution Take 2.5 mg by nebulization 3 (three) times daily.    [provider]  ALPRAZolam Duanne Moron) 1 MG tablet Take 1 mg by mouth 4 (four) times daily.    [provider]  amoxicillin (AMOXIL) 500 MG capsule  02/16/20   [provider]  amoxicillin-clavulanate (AUGMENTIN) 875-125 MG tablet Take 1 tablet by mouth 2 (two) times daily. 02/05/20   [provider]  atorvastatin (LIPITOR) 40 MG tablet Take 40 mg by mouth daily. 01/27/20   [provider]  budesonide-formoterol (SYMBICORT) 160-4.5 MCG/ACT inhaler SMARTSIG:2 Puff(s) By Mouth Twice Daily 01/22/20   [provider]  DULoxetine (CYMBALTA) 30  MG capsule Take 30 mg by mouth daily. 02/05/20   [provider]  HYDROcodone-acetaminophen (NORCO/VICODIN) 5-325 MG per tablet Take 1-2 tablets by mouth every 6 (six) hours as needed for pain. 07/26/13   Bonk, Harrington Challenger, MD  hydrOXYzine (ATARAX/VISTARIL) 25 MG tablet Take 25 mg by mouth 3 (three) times daily as needed. 01/22/20   [provider]  ibuprofen (ADVIL) 800 MG tablet  02/16/20   [provider]  ibuprofen (ADVIL,MOTRIN) 600 MG tablet Take 1 tablet (600 mg total) by mouth every 6 (six) hours as needed for pain. 04/04/13   Evalee Jefferson, PA-C  ipratropium (ATROVENT) 0.02 % nebulizer solution Take 500 mcg by nebulization at bedtime.    [provider]  LINZESS 290 MCG CAPS capsule Take 290 mcg by mouth daily. 01/27/20   [provider]  meloxicam (MOBIC) 7.5 MG tablet Take 7.5 mg by mouth daily. 02/03/20   [provider]  nitroGLYCERIN (NITROSTAT) 0.4 MG SL tablet Place 0.4 mg under the tongue every 5 (five) minutes x 3 doses as needed for chest pain.    [provider]    Allergies    Codeine and Adhesive [tape]  Review of Systems   Review of Systems All other systems are reviewed and are negative for acute change except as noted in the HPI.  Physical Exam Updated Vital Signs BP (!) 191/102 (BP Location: Right Arm)   Pulse 72   Temp 98 F (36.7 C) (Oral)   Resp 18   Ht 5\' 3"  (1.6 m)   Wt 69.4 kg   SpO2 100%   BMI 27.10 kg/m   Physical Exam Vitals and nursing note reviewed.  Constitutional:      General: She is not in acute distress.    Appearance: She is not ill-appearing.  HENT:     Head: Normocephalic and atraumatic.     Comments: No sinus or temporal tenderness.    Right Ear: Tympanic membrane and external ear normal.     Left Ear: Tympanic membrane and external ear normal.     Nose: Nose normal.     Mouth/Throat:     Mouth: Mucous membranes are moist.     Pharynx: Oropharynx is clear.  Eyes:     General:  No scleral icterus.       Right eye: No discharge.        Left eye: No discharge.     Extraocular Movements: Extraocular movements intact.     Conjunctiva/sclera: Conjunctivae normal.     Pupils: Pupils are equal, round, and reactive to light.  Neck:     Vascular: No JVD.     Comments: Full ROM intact without spinous process TTP. No bony stepoffs or deformities, no paraspinous muscle TTP or muscle spasms. No rigidity or meningeal signs. No bruising, erythema, or swelling.  Cardiovascular:  Rate and Rhythm: Normal rate and regular rhythm.     Pulses: Normal pulses.          Radial pulses are 2+ on the right side and 2+ on the left side.     Heart sounds: Normal heart sounds.  Pulmonary:     Comments: Lungs clear to auscultation in all fields. Symmetric chest rise. No wheezing, rales, or rhonchi. Abdominal:     Comments: Abdomen is soft, non-distended, and non-tender in all quadrants. No rigidity, no guarding. No peritoneal signs.  Musculoskeletal:        General: Normal range of motion.     Cervical back: Normal range of motion.  Skin:    General: Skin is warm and dry.     Capillary Refill: Capillary refill takes less than 2 seconds.  Neurological:     Mental Status: She is oriented to person, place, and time.     GCS: GCS eye subscore is 4. GCS verbal subscore is 5. GCS motor subscore is 6.     Comments: Fluent speech, no facial droop.  Speech is clear and goal oriented, follows commands CN III-XII intact, no facial droop Normal strength in upper and lower extremities bilaterally including dorsiflexion and plantar flexion, strong and equal grip strength Sensation normal to light and sharp touch Moves extremities without ataxia, coordination intact Normal finger to nose and rapid alternating movements Normal gait and balance   Psychiatric:        Mood and Affect: Mood is anxious.        Behavior: Behavior normal.     ED Results / Procedures / Treatments   Labs (all  labs ordered are listed, but only abnormal results are displayed) Labs Reviewed  BASIC METABOLIC PANEL - Abnormal; Notable for the following components:      Result Value   BUN 22 (*)    All other components within normal limits  URINALYSIS, ROUTINE W REFLEX MICROSCOPIC - Abnormal; Notable for the following components:   Hgb urine dipstick SMALL (*)    All other components within normal limits  CBC WITH DIFFERENTIAL/PLATELET    EKG None  Radiology No results found.  Procedures Procedures (including critical care time)  Medications Ordered in ED Medications  acetaminophen (TYLENOL) tablet 650 mg (650 mg Oral Given 02/16/20 1917)    ED Course  I have reviewed the triage vital signs and the nursing notes.  Pertinent labs & imaging results that were available during my care of the patient were reviewed by me and considered in my medical decision making (see chart for details).  Clinical Course as of Feb 16 2032  Tue Feb 16, 2020  2026 Glucose 85, snack given   [KA]    Clinical Course User Index [KA] Sherene Sires, [place]   Vitals:   02/16/20 1813 02/16/20 1815 02/16/20 2015  BP: (!) 191/102  (!) 146/84  Pulse: 72  (!) 59  Resp: 18  19  Temp: 98 F (36.7 C)    TempSrc: Oral    SpO2: 100%  99%  Weight:  69.4 kg   Height:  5\' 3"  (1.6 m)     MDM Rules/Calculators/A&P                      Patient seen and examined. Patient presents awake, alert, hemodynamically stable, afebrile, non toxic. BP on arrival is 191/102. She appears anxious. Lungs are clear to auscultation. Neuro exam is normal. Presentation is like pts typical HA  and non concerning for [facility], ICH, Meningitis, or temporal arteritis. It is reassuring patient is afebrile with no focal neuro deficits, nuchal rigidity, or change in vision.  CBC without leukocytosis, no anemia. BMP without electrolyte derangements, glucose 85, BUN only slight bumped at 2, creatinine normal, GFR normal.  UA without infection, does  have small hemoglobinuria, no RBCs. Negative protein. Pt HA treated with PO tylenol and improved while in ED.  Also when blood pressure was rechecked it had improved 146/84. At discharge pressure 168/103. Discussed with patient option of starting on Norvasc now, but she would rather follow up with pcp tomorrow. This seems reasonable.  Work up today is reassuring. No signs of end organ damage.  Discussed with patient the need for close follow-up with primary primary care physician. She already has an appointment schedule tomorrow morning with PCP. Recommend repeat UA. Findings and plan of care discussed with supervising physician Dr. Effie Shy.   Portions of this note were generated with Scientist, clinical (histocompatibility and immunogenetics). Dictation errors may occur despite best attempts at proofreading.   Final Clinical Impression(s) / ED Diagnoses Final diagnoses:  Elevated blood pressure reading    Rx / DC Orders ED Discharge Orders    None       Kathyrn Lass 02/16/20 2051    Mancel Bale, MD 02/16/20 2141

## 2020-02-16 NOTE — Discharge Instructions (Signed)
You have been seen today for high blood pressure. Please read and follow all provided instructions. Return to the emergency room for worsening condition or new concerning symptoms.    Your blood pressure was elevated when he got to the emergency department however it improved while you were here.  Blood work was all normal.  Your urine sample did show small amount of blood, recommend to have repeat urine sample collection at your primary care doctor's office.  1. Medications:  No new prescriptions today.  Continue usual home medications Take medications as prescribed. Please review all of the medicines and only take them if you do not have an allergy to them.   2. Treatment: rest, drink plenty of fluids  3. Follow Up:  Please follow up with primary care provider tomorrow at your scheduled appointment.   ?

## 2020-02-16 NOTE — ED Triage Notes (Signed)
PT went to the dentist today and bp was elevated.  Pt states she used to be on bp meds but stopped taking all her medications

## 2020-02-17 DIAGNOSIS — M9902 Segmental and somatic dysfunction of thoracic region: Secondary | ICD-10-CM | POA: Diagnosis not present

## 2020-02-17 DIAGNOSIS — R3129 Other microscopic hematuria: Secondary | ICD-10-CM | POA: Diagnosis not present

## 2020-02-17 DIAGNOSIS — I10 Essential (primary) hypertension: Secondary | ICD-10-CM | POA: Diagnosis not present

## 2020-02-17 DIAGNOSIS — M9901 Segmental and somatic dysfunction of cervical region: Secondary | ICD-10-CM | POA: Diagnosis not present

## 2020-02-17 DIAGNOSIS — M9903 Segmental and somatic dysfunction of lumbar region: Secondary | ICD-10-CM | POA: Diagnosis not present

## 2020-02-17 DIAGNOSIS — R748 Abnormal levels of other serum enzymes: Secondary | ICD-10-CM | POA: Diagnosis not present

## 2020-02-17 DIAGNOSIS — M5137 Other intervertebral disc degeneration, lumbosacral region: Secondary | ICD-10-CM | POA: Diagnosis not present

## 2020-02-17 DIAGNOSIS — Z09 Encounter for follow-up examination after completed treatment for conditions other than malignant neoplasm: Secondary | ICD-10-CM | POA: Diagnosis not present

## 2020-02-18 DIAGNOSIS — M5137 Other intervertebral disc degeneration, lumbosacral region: Secondary | ICD-10-CM | POA: Diagnosis not present

## 2020-02-18 DIAGNOSIS — M9902 Segmental and somatic dysfunction of thoracic region: Secondary | ICD-10-CM | POA: Diagnosis not present

## 2020-02-18 DIAGNOSIS — M9903 Segmental and somatic dysfunction of lumbar region: Secondary | ICD-10-CM | POA: Diagnosis not present

## 2020-02-18 DIAGNOSIS — M9901 Segmental and somatic dysfunction of cervical region: Secondary | ICD-10-CM | POA: Diagnosis not present

## 2020-02-23 DIAGNOSIS — M9901 Segmental and somatic dysfunction of cervical region: Secondary | ICD-10-CM | POA: Diagnosis not present

## 2020-02-23 DIAGNOSIS — M9903 Segmental and somatic dysfunction of lumbar region: Secondary | ICD-10-CM | POA: Diagnosis not present

## 2020-02-23 DIAGNOSIS — M5137 Other intervertebral disc degeneration, lumbosacral region: Secondary | ICD-10-CM | POA: Diagnosis not present

## 2020-02-23 DIAGNOSIS — M9902 Segmental and somatic dysfunction of thoracic region: Secondary | ICD-10-CM | POA: Diagnosis not present

## 2020-02-24 DIAGNOSIS — M9903 Segmental and somatic dysfunction of lumbar region: Secondary | ICD-10-CM | POA: Diagnosis not present

## 2020-02-24 DIAGNOSIS — M5137 Other intervertebral disc degeneration, lumbosacral region: Secondary | ICD-10-CM | POA: Diagnosis not present

## 2020-02-24 DIAGNOSIS — M9901 Segmental and somatic dysfunction of cervical region: Secondary | ICD-10-CM | POA: Diagnosis not present

## 2020-02-24 DIAGNOSIS — M9902 Segmental and somatic dysfunction of thoracic region: Secondary | ICD-10-CM | POA: Diagnosis not present

## 2020-02-25 DIAGNOSIS — M9902 Segmental and somatic dysfunction of thoracic region: Secondary | ICD-10-CM | POA: Diagnosis not present

## 2020-02-25 DIAGNOSIS — M9901 Segmental and somatic dysfunction of cervical region: Secondary | ICD-10-CM | POA: Diagnosis not present

## 2020-02-25 DIAGNOSIS — M5137 Other intervertebral disc degeneration, lumbosacral region: Secondary | ICD-10-CM | POA: Diagnosis not present

## 2020-02-25 DIAGNOSIS — M9903 Segmental and somatic dysfunction of lumbar region: Secondary | ICD-10-CM | POA: Diagnosis not present

## 2020-03-03 DIAGNOSIS — R112 Nausea with vomiting, unspecified: Secondary | ICD-10-CM | POA: Diagnosis not present

## 2020-03-03 DIAGNOSIS — R1084 Generalized abdominal pain: Secondary | ICD-10-CM | POA: Diagnosis not present

## 2020-03-03 DIAGNOSIS — Z86010 Personal history of colonic polyps: Secondary | ICD-10-CM | POA: Diagnosis not present

## 2020-03-03 DIAGNOSIS — K5900 Constipation, unspecified: Secondary | ICD-10-CM | POA: Diagnosis not present

## 2020-03-04 DIAGNOSIS — I10 Essential (primary) hypertension: Secondary | ICD-10-CM | POA: Diagnosis not present

## 2020-03-04 DIAGNOSIS — R748 Abnormal levels of other serum enzymes: Secondary | ICD-10-CM | POA: Diagnosis not present

## 2020-03-04 DIAGNOSIS — R002 Palpitations: Secondary | ICD-10-CM | POA: Diagnosis not present

## 2020-03-04 DIAGNOSIS — K5900 Constipation, unspecified: Secondary | ICD-10-CM | POA: Diagnosis not present

## 2020-03-10 DIAGNOSIS — I25119 Atherosclerotic heart disease of native coronary artery with unspecified angina pectoris: Secondary | ICD-10-CM | POA: Diagnosis not present

## 2020-03-10 DIAGNOSIS — I10 Essential (primary) hypertension: Secondary | ICD-10-CM | POA: Diagnosis not present

## 2020-03-28 DIAGNOSIS — Z800 Family history of malignant neoplasm of digestive organs: Secondary | ICD-10-CM | POA: Diagnosis not present

## 2020-03-28 DIAGNOSIS — K3189 Other diseases of stomach and duodenum: Secondary | ICD-10-CM | POA: Diagnosis not present

## 2020-03-28 DIAGNOSIS — R112 Nausea with vomiting, unspecified: Secondary | ICD-10-CM | POA: Diagnosis not present

## 2020-03-28 DIAGNOSIS — Z1211 Encounter for screening for malignant neoplasm of colon: Secondary | ICD-10-CM | POA: Diagnosis not present

## 2020-03-28 DIAGNOSIS — K5730 Diverticulosis of large intestine without perforation or abscess without bleeding: Secondary | ICD-10-CM | POA: Diagnosis not present

## 2020-03-30 DIAGNOSIS — R69 Illness, unspecified: Secondary | ICD-10-CM | POA: Diagnosis not present

## 2020-04-04 DIAGNOSIS — N200 Calculus of kidney: Secondary | ICD-10-CM | POA: Diagnosis not present

## 2020-04-04 DIAGNOSIS — I10 Essential (primary) hypertension: Secondary | ICD-10-CM | POA: Diagnosis not present

## 2020-04-04 DIAGNOSIS — J439 Emphysema, unspecified: Secondary | ICD-10-CM | POA: Diagnosis not present

## 2020-04-04 DIAGNOSIS — I2510 Atherosclerotic heart disease of native coronary artery without angina pectoris: Secondary | ICD-10-CM | POA: Diagnosis not present

## 2020-04-04 DIAGNOSIS — R110 Nausea: Secondary | ICD-10-CM | POA: Diagnosis not present

## 2020-04-04 DIAGNOSIS — I6782 Cerebral ischemia: Secondary | ICD-10-CM | POA: Diagnosis not present

## 2020-04-04 DIAGNOSIS — R471 Dysarthria and anarthria: Secondary | ICD-10-CM | POA: Diagnosis not present

## 2020-04-04 DIAGNOSIS — Z8673 Personal history of transient ischemic attack (TIA), and cerebral infarction without residual deficits: Secondary | ICD-10-CM | POA: Diagnosis not present

## 2020-04-04 DIAGNOSIS — G459 Transient cerebral ischemic attack, unspecified: Secondary | ICD-10-CM | POA: Diagnosis not present

## 2020-04-04 DIAGNOSIS — F17200 Nicotine dependence, unspecified, uncomplicated: Secondary | ICD-10-CM | POA: Diagnosis not present

## 2020-04-04 DIAGNOSIS — G8194 Hemiplegia, unspecified affecting left nondominant side: Secondary | ICD-10-CM | POA: Diagnosis not present

## 2020-04-04 DIAGNOSIS — R200 Anesthesia of skin: Secondary | ICD-10-CM | POA: Diagnosis not present

## 2020-04-04 DIAGNOSIS — R531 Weakness: Secondary | ICD-10-CM | POA: Diagnosis not present

## 2020-04-04 DIAGNOSIS — E785 Hyperlipidemia, unspecified: Secondary | ICD-10-CM | POA: Diagnosis not present

## 2020-04-04 DIAGNOSIS — I639 Cerebral infarction, unspecified: Secondary | ICD-10-CM | POA: Diagnosis not present

## 2020-04-05 DIAGNOSIS — Z8673 Personal history of transient ischemic attack (TIA), and cerebral infarction without residual deficits: Secondary | ICD-10-CM | POA: Diagnosis not present

## 2020-04-05 DIAGNOSIS — J449 Chronic obstructive pulmonary disease, unspecified: Secondary | ICD-10-CM | POA: Diagnosis not present

## 2020-04-05 DIAGNOSIS — I639 Cerebral infarction, unspecified: Secondary | ICD-10-CM | POA: Diagnosis not present

## 2020-04-05 DIAGNOSIS — Z720 Tobacco use: Secondary | ICD-10-CM | POA: Diagnosis not present

## 2020-04-05 DIAGNOSIS — I2510 Atherosclerotic heart disease of native coronary artery without angina pectoris: Secondary | ICD-10-CM | POA: Diagnosis not present

## 2020-04-05 DIAGNOSIS — I69954 Hemiplegia and hemiparesis following unspecified cerebrovascular disease affecting left non-dominant side: Secondary | ICD-10-CM | POA: Diagnosis not present

## 2020-04-05 DIAGNOSIS — I10 Essential (primary) hypertension: Secondary | ICD-10-CM | POA: Diagnosis not present

## 2020-04-05 DIAGNOSIS — E785 Hyperlipidemia, unspecified: Secondary | ICD-10-CM | POA: Diagnosis not present

## 2020-04-06 DIAGNOSIS — I2510 Atherosclerotic heart disease of native coronary artery without angina pectoris: Secondary | ICD-10-CM | POA: Diagnosis not present

## 2020-04-06 DIAGNOSIS — I10 Essential (primary) hypertension: Secondary | ICD-10-CM | POA: Diagnosis not present

## 2020-04-06 DIAGNOSIS — Z720 Tobacco use: Secondary | ICD-10-CM | POA: Diagnosis not present

## 2020-04-06 DIAGNOSIS — I639 Cerebral infarction, unspecified: Secondary | ICD-10-CM | POA: Diagnosis not present

## 2020-04-06 DIAGNOSIS — E785 Hyperlipidemia, unspecified: Secondary | ICD-10-CM | POA: Diagnosis not present

## 2020-04-06 DIAGNOSIS — J449 Chronic obstructive pulmonary disease, unspecified: Secondary | ICD-10-CM | POA: Diagnosis not present

## 2020-04-06 DIAGNOSIS — Z8673 Personal history of transient ischemic attack (TIA), and cerebral infarction without residual deficits: Secondary | ICD-10-CM | POA: Diagnosis not present

## 2020-04-06 DIAGNOSIS — I69954 Hemiplegia and hemiparesis following unspecified cerebrovascular disease affecting left non-dominant side: Secondary | ICD-10-CM | POA: Diagnosis not present

## 2020-04-12 DIAGNOSIS — I69954 Hemiplegia and hemiparesis following unspecified cerebrovascular disease affecting left non-dominant side: Secondary | ICD-10-CM | POA: Diagnosis not present

## 2020-04-12 DIAGNOSIS — Z720 Tobacco use: Secondary | ICD-10-CM | POA: Diagnosis not present

## 2020-04-12 DIAGNOSIS — E785 Hyperlipidemia, unspecified: Secondary | ICD-10-CM | POA: Diagnosis not present

## 2020-04-12 DIAGNOSIS — I10 Essential (primary) hypertension: Secondary | ICD-10-CM | POA: Diagnosis not present

## 2020-04-12 DIAGNOSIS — I639 Cerebral infarction, unspecified: Secondary | ICD-10-CM | POA: Diagnosis not present

## 2020-04-13 DIAGNOSIS — J449 Chronic obstructive pulmonary disease, unspecified: Secondary | ICD-10-CM | POA: Diagnosis not present

## 2020-04-13 DIAGNOSIS — E785 Hyperlipidemia, unspecified: Secondary | ICD-10-CM | POA: Diagnosis not present

## 2020-04-13 DIAGNOSIS — I69354 Hemiplegia and hemiparesis following cerebral infarction affecting left non-dominant side: Secondary | ICD-10-CM | POA: Diagnosis not present

## 2020-04-13 DIAGNOSIS — Z09 Encounter for follow-up examination after completed treatment for conditions other than malignant neoplasm: Secondary | ICD-10-CM | POA: Diagnosis not present

## 2020-04-13 DIAGNOSIS — Z720 Tobacco use: Secondary | ICD-10-CM | POA: Diagnosis not present

## 2020-04-13 DIAGNOSIS — R0602 Shortness of breath: Secondary | ICD-10-CM | POA: Diagnosis not present

## 2020-04-13 DIAGNOSIS — F322 Major depressive disorder, single episode, severe without psychotic features: Secondary | ICD-10-CM | POA: Diagnosis not present

## 2020-04-13 DIAGNOSIS — I69398 Other sequelae of cerebral infarction: Secondary | ICD-10-CM | POA: Diagnosis not present

## 2020-04-13 DIAGNOSIS — Z8673 Personal history of transient ischemic attack (TIA), and cerebral infarction without residual deficits: Secondary | ICD-10-CM | POA: Diagnosis not present

## 2020-04-14 DIAGNOSIS — G4700 Insomnia, unspecified: Secondary | ICD-10-CM | POA: Diagnosis not present

## 2020-04-14 DIAGNOSIS — E785 Hyperlipidemia, unspecified: Secondary | ICD-10-CM | POA: Diagnosis not present

## 2020-04-14 DIAGNOSIS — G2581 Restless legs syndrome: Secondary | ICD-10-CM | POA: Diagnosis not present

## 2020-04-14 DIAGNOSIS — F17210 Nicotine dependence, cigarettes, uncomplicated: Secondary | ICD-10-CM | POA: Diagnosis not present

## 2020-04-14 DIAGNOSIS — I69354 Hemiplegia and hemiparesis following cerebral infarction affecting left non-dominant side: Secondary | ICD-10-CM | POA: Diagnosis not present

## 2020-04-14 DIAGNOSIS — I2510 Atherosclerotic heart disease of native coronary artery without angina pectoris: Secondary | ICD-10-CM | POA: Diagnosis not present

## 2020-04-14 DIAGNOSIS — F331 Major depressive disorder, recurrent, moderate: Secondary | ICD-10-CM | POA: Diagnosis not present

## 2020-04-14 DIAGNOSIS — I10 Essential (primary) hypertension: Secondary | ICD-10-CM | POA: Diagnosis not present

## 2020-04-14 DIAGNOSIS — F419 Anxiety disorder, unspecified: Secondary | ICD-10-CM | POA: Diagnosis not present

## 2020-04-14 DIAGNOSIS — J439 Emphysema, unspecified: Secondary | ICD-10-CM | POA: Diagnosis not present

## 2020-04-19 DIAGNOSIS — I69354 Hemiplegia and hemiparesis following cerebral infarction affecting left non-dominant side: Secondary | ICD-10-CM | POA: Diagnosis not present

## 2020-04-19 DIAGNOSIS — G2581 Restless legs syndrome: Secondary | ICD-10-CM | POA: Diagnosis not present

## 2020-04-19 DIAGNOSIS — E785 Hyperlipidemia, unspecified: Secondary | ICD-10-CM | POA: Diagnosis not present

## 2020-04-19 DIAGNOSIS — F419 Anxiety disorder, unspecified: Secondary | ICD-10-CM | POA: Diagnosis not present

## 2020-04-19 DIAGNOSIS — I2510 Atherosclerotic heart disease of native coronary artery without angina pectoris: Secondary | ICD-10-CM | POA: Diagnosis not present

## 2020-04-19 DIAGNOSIS — I10 Essential (primary) hypertension: Secondary | ICD-10-CM | POA: Diagnosis not present

## 2020-04-19 DIAGNOSIS — G4700 Insomnia, unspecified: Secondary | ICD-10-CM | POA: Diagnosis not present

## 2020-04-19 DIAGNOSIS — F17210 Nicotine dependence, cigarettes, uncomplicated: Secondary | ICD-10-CM | POA: Diagnosis not present

## 2020-04-19 DIAGNOSIS — J439 Emphysema, unspecified: Secondary | ICD-10-CM | POA: Diagnosis not present

## 2020-04-23 DIAGNOSIS — I10 Essential (primary) hypertension: Secondary | ICD-10-CM | POA: Diagnosis not present

## 2020-04-23 DIAGNOSIS — I69354 Hemiplegia and hemiparesis following cerebral infarction affecting left non-dominant side: Secondary | ICD-10-CM | POA: Diagnosis not present

## 2020-04-23 DIAGNOSIS — E785 Hyperlipidemia, unspecified: Secondary | ICD-10-CM | POA: Diagnosis not present

## 2020-04-23 DIAGNOSIS — I2510 Atherosclerotic heart disease of native coronary artery without angina pectoris: Secondary | ICD-10-CM | POA: Diagnosis not present

## 2020-04-23 DIAGNOSIS — F419 Anxiety disorder, unspecified: Secondary | ICD-10-CM | POA: Diagnosis not present

## 2020-04-23 DIAGNOSIS — J439 Emphysema, unspecified: Secondary | ICD-10-CM | POA: Diagnosis not present

## 2020-04-23 DIAGNOSIS — G2581 Restless legs syndrome: Secondary | ICD-10-CM | POA: Diagnosis not present

## 2020-04-23 DIAGNOSIS — G4700 Insomnia, unspecified: Secondary | ICD-10-CM | POA: Diagnosis not present

## 2020-04-23 DIAGNOSIS — F17210 Nicotine dependence, cigarettes, uncomplicated: Secondary | ICD-10-CM | POA: Diagnosis not present

## 2020-04-26 DIAGNOSIS — F17210 Nicotine dependence, cigarettes, uncomplicated: Secondary | ICD-10-CM | POA: Diagnosis not present

## 2020-04-26 DIAGNOSIS — I2510 Atherosclerotic heart disease of native coronary artery without angina pectoris: Secondary | ICD-10-CM | POA: Diagnosis not present

## 2020-04-26 DIAGNOSIS — I10 Essential (primary) hypertension: Secondary | ICD-10-CM | POA: Diagnosis not present

## 2020-04-26 DIAGNOSIS — J439 Emphysema, unspecified: Secondary | ICD-10-CM | POA: Diagnosis not present

## 2020-04-26 DIAGNOSIS — G2581 Restless legs syndrome: Secondary | ICD-10-CM | POA: Diagnosis not present

## 2020-04-26 DIAGNOSIS — I63239 Cerebral infarction due to unspecified occlusion or stenosis of unspecified carotid arteries: Secondary | ICD-10-CM | POA: Diagnosis not present

## 2020-04-26 DIAGNOSIS — E785 Hyperlipidemia, unspecified: Secondary | ICD-10-CM | POA: Diagnosis not present

## 2020-04-26 DIAGNOSIS — F419 Anxiety disorder, unspecified: Secondary | ICD-10-CM | POA: Diagnosis not present

## 2020-04-26 DIAGNOSIS — G4700 Insomnia, unspecified: Secondary | ICD-10-CM | POA: Diagnosis not present

## 2020-04-26 DIAGNOSIS — Z720 Tobacco use: Secondary | ICD-10-CM | POA: Diagnosis not present

## 2020-04-26 DIAGNOSIS — I69354 Hemiplegia and hemiparesis following cerebral infarction affecting left non-dominant side: Secondary | ICD-10-CM | POA: Diagnosis not present

## 2020-04-28 DIAGNOSIS — G4700 Insomnia, unspecified: Secondary | ICD-10-CM | POA: Diagnosis not present

## 2020-04-28 DIAGNOSIS — F419 Anxiety disorder, unspecified: Secondary | ICD-10-CM | POA: Diagnosis not present

## 2020-04-28 DIAGNOSIS — F17210 Nicotine dependence, cigarettes, uncomplicated: Secondary | ICD-10-CM | POA: Diagnosis not present

## 2020-04-28 DIAGNOSIS — G2581 Restless legs syndrome: Secondary | ICD-10-CM | POA: Diagnosis not present

## 2020-04-28 DIAGNOSIS — I2510 Atherosclerotic heart disease of native coronary artery without angina pectoris: Secondary | ICD-10-CM | POA: Diagnosis not present

## 2020-04-28 DIAGNOSIS — I69354 Hemiplegia and hemiparesis following cerebral infarction affecting left non-dominant side: Secondary | ICD-10-CM | POA: Diagnosis not present

## 2020-04-28 DIAGNOSIS — I10 Essential (primary) hypertension: Secondary | ICD-10-CM | POA: Diagnosis not present

## 2020-04-28 DIAGNOSIS — E785 Hyperlipidemia, unspecified: Secondary | ICD-10-CM | POA: Diagnosis not present

## 2020-04-28 DIAGNOSIS — J439 Emphysema, unspecified: Secondary | ICD-10-CM | POA: Diagnosis not present

## 2020-05-03 DIAGNOSIS — G4700 Insomnia, unspecified: Secondary | ICD-10-CM | POA: Diagnosis not present

## 2020-05-03 DIAGNOSIS — F419 Anxiety disorder, unspecified: Secondary | ICD-10-CM | POA: Diagnosis not present

## 2020-05-03 DIAGNOSIS — G2581 Restless legs syndrome: Secondary | ICD-10-CM | POA: Diagnosis not present

## 2020-05-03 DIAGNOSIS — I10 Essential (primary) hypertension: Secondary | ICD-10-CM | POA: Diagnosis not present

## 2020-05-03 DIAGNOSIS — F17210 Nicotine dependence, cigarettes, uncomplicated: Secondary | ICD-10-CM | POA: Diagnosis not present

## 2020-05-03 DIAGNOSIS — I69354 Hemiplegia and hemiparesis following cerebral infarction affecting left non-dominant side: Secondary | ICD-10-CM | POA: Diagnosis not present

## 2020-05-03 DIAGNOSIS — J439 Emphysema, unspecified: Secondary | ICD-10-CM | POA: Diagnosis not present

## 2020-05-03 DIAGNOSIS — E785 Hyperlipidemia, unspecified: Secondary | ICD-10-CM | POA: Diagnosis not present

## 2020-05-03 DIAGNOSIS — I2510 Atherosclerotic heart disease of native coronary artery without angina pectoris: Secondary | ICD-10-CM | POA: Diagnosis not present

## 2020-05-04 DIAGNOSIS — G4700 Insomnia, unspecified: Secondary | ICD-10-CM | POA: Diagnosis not present

## 2020-05-04 DIAGNOSIS — I69354 Hemiplegia and hemiparesis following cerebral infarction affecting left non-dominant side: Secondary | ICD-10-CM | POA: Diagnosis not present

## 2020-05-04 DIAGNOSIS — G2581 Restless legs syndrome: Secondary | ICD-10-CM | POA: Diagnosis not present

## 2020-05-04 DIAGNOSIS — E785 Hyperlipidemia, unspecified: Secondary | ICD-10-CM | POA: Diagnosis not present

## 2020-05-04 DIAGNOSIS — F17210 Nicotine dependence, cigarettes, uncomplicated: Secondary | ICD-10-CM | POA: Diagnosis not present

## 2020-05-04 DIAGNOSIS — I10 Essential (primary) hypertension: Secondary | ICD-10-CM | POA: Diagnosis not present

## 2020-05-04 DIAGNOSIS — I2510 Atherosclerotic heart disease of native coronary artery without angina pectoris: Secondary | ICD-10-CM | POA: Diagnosis not present

## 2020-05-04 DIAGNOSIS — J439 Emphysema, unspecified: Secondary | ICD-10-CM | POA: Diagnosis not present

## 2020-05-04 DIAGNOSIS — F419 Anxiety disorder, unspecified: Secondary | ICD-10-CM | POA: Diagnosis not present

## 2020-05-05 DIAGNOSIS — I25119 Atherosclerotic heart disease of native coronary artery with unspecified angina pectoris: Secondary | ICD-10-CM | POA: Diagnosis not present

## 2020-05-05 DIAGNOSIS — F17210 Nicotine dependence, cigarettes, uncomplicated: Secondary | ICD-10-CM | POA: Diagnosis not present

## 2020-05-05 DIAGNOSIS — I69354 Hemiplegia and hemiparesis following cerebral infarction affecting left non-dominant side: Secondary | ICD-10-CM | POA: Diagnosis not present

## 2020-05-05 DIAGNOSIS — I10 Essential (primary) hypertension: Secondary | ICD-10-CM | POA: Diagnosis not present

## 2020-05-05 DIAGNOSIS — I63239 Cerebral infarction due to unspecified occlusion or stenosis of unspecified carotid arteries: Secondary | ICD-10-CM | POA: Diagnosis not present

## 2020-05-05 DIAGNOSIS — G459 Transient cerebral ischemic attack, unspecified: Secondary | ICD-10-CM | POA: Diagnosis not present

## 2020-05-06 DIAGNOSIS — F419 Anxiety disorder, unspecified: Secondary | ICD-10-CM | POA: Diagnosis not present

## 2020-05-06 DIAGNOSIS — J439 Emphysema, unspecified: Secondary | ICD-10-CM | POA: Diagnosis not present

## 2020-05-06 DIAGNOSIS — I2510 Atherosclerotic heart disease of native coronary artery without angina pectoris: Secondary | ICD-10-CM | POA: Diagnosis not present

## 2020-05-06 DIAGNOSIS — G4700 Insomnia, unspecified: Secondary | ICD-10-CM | POA: Diagnosis not present

## 2020-05-06 DIAGNOSIS — G2581 Restless legs syndrome: Secondary | ICD-10-CM | POA: Diagnosis not present

## 2020-05-06 DIAGNOSIS — F17210 Nicotine dependence, cigarettes, uncomplicated: Secondary | ICD-10-CM | POA: Diagnosis not present

## 2020-05-06 DIAGNOSIS — I69354 Hemiplegia and hemiparesis following cerebral infarction affecting left non-dominant side: Secondary | ICD-10-CM | POA: Diagnosis not present

## 2020-05-06 DIAGNOSIS — I10 Essential (primary) hypertension: Secondary | ICD-10-CM | POA: Diagnosis not present

## 2020-05-06 DIAGNOSIS — E785 Hyperlipidemia, unspecified: Secondary | ICD-10-CM | POA: Diagnosis not present

## 2020-05-09 DIAGNOSIS — F17210 Nicotine dependence, cigarettes, uncomplicated: Secondary | ICD-10-CM | POA: Diagnosis not present

## 2020-05-09 DIAGNOSIS — F419 Anxiety disorder, unspecified: Secondary | ICD-10-CM | POA: Diagnosis not present

## 2020-05-09 DIAGNOSIS — G4700 Insomnia, unspecified: Secondary | ICD-10-CM | POA: Diagnosis not present

## 2020-05-09 DIAGNOSIS — G2581 Restless legs syndrome: Secondary | ICD-10-CM | POA: Diagnosis not present

## 2020-05-09 DIAGNOSIS — I2510 Atherosclerotic heart disease of native coronary artery without angina pectoris: Secondary | ICD-10-CM | POA: Diagnosis not present

## 2020-05-09 DIAGNOSIS — I10 Essential (primary) hypertension: Secondary | ICD-10-CM | POA: Diagnosis not present

## 2020-05-09 DIAGNOSIS — E785 Hyperlipidemia, unspecified: Secondary | ICD-10-CM | POA: Diagnosis not present

## 2020-05-09 DIAGNOSIS — I69354 Hemiplegia and hemiparesis following cerebral infarction affecting left non-dominant side: Secondary | ICD-10-CM | POA: Diagnosis not present

## 2020-05-09 DIAGNOSIS — J439 Emphysema, unspecified: Secondary | ICD-10-CM | POA: Diagnosis not present

## 2020-05-10 DIAGNOSIS — I69354 Hemiplegia and hemiparesis following cerebral infarction affecting left non-dominant side: Secondary | ICD-10-CM | POA: Diagnosis not present

## 2020-05-10 DIAGNOSIS — I10 Essential (primary) hypertension: Secondary | ICD-10-CM | POA: Diagnosis not present

## 2020-05-10 DIAGNOSIS — I2510 Atherosclerotic heart disease of native coronary artery without angina pectoris: Secondary | ICD-10-CM | POA: Diagnosis not present

## 2020-05-10 DIAGNOSIS — E785 Hyperlipidemia, unspecified: Secondary | ICD-10-CM | POA: Diagnosis not present

## 2020-05-10 DIAGNOSIS — G2581 Restless legs syndrome: Secondary | ICD-10-CM | POA: Diagnosis not present

## 2020-05-10 DIAGNOSIS — F17210 Nicotine dependence, cigarettes, uncomplicated: Secondary | ICD-10-CM | POA: Diagnosis not present

## 2020-05-10 DIAGNOSIS — G4700 Insomnia, unspecified: Secondary | ICD-10-CM | POA: Diagnosis not present

## 2020-05-10 DIAGNOSIS — F419 Anxiety disorder, unspecified: Secondary | ICD-10-CM | POA: Diagnosis not present

## 2020-05-10 DIAGNOSIS — J439 Emphysema, unspecified: Secondary | ICD-10-CM | POA: Diagnosis not present

## 2020-05-11 DIAGNOSIS — I10 Essential (primary) hypertension: Secondary | ICD-10-CM | POA: Diagnosis not present

## 2020-05-11 DIAGNOSIS — E785 Hyperlipidemia, unspecified: Secondary | ICD-10-CM | POA: Diagnosis not present

## 2020-05-11 DIAGNOSIS — J439 Emphysema, unspecified: Secondary | ICD-10-CM | POA: Diagnosis not present

## 2020-05-11 DIAGNOSIS — G4700 Insomnia, unspecified: Secondary | ICD-10-CM | POA: Diagnosis not present

## 2020-05-11 DIAGNOSIS — I2510 Atherosclerotic heart disease of native coronary artery without angina pectoris: Secondary | ICD-10-CM | POA: Diagnosis not present

## 2020-05-11 DIAGNOSIS — G2581 Restless legs syndrome: Secondary | ICD-10-CM | POA: Diagnosis not present

## 2020-05-11 DIAGNOSIS — F17210 Nicotine dependence, cigarettes, uncomplicated: Secondary | ICD-10-CM | POA: Diagnosis not present

## 2020-05-11 DIAGNOSIS — F419 Anxiety disorder, unspecified: Secondary | ICD-10-CM | POA: Diagnosis not present

## 2020-05-11 DIAGNOSIS — I69354 Hemiplegia and hemiparesis following cerebral infarction affecting left non-dominant side: Secondary | ICD-10-CM | POA: Diagnosis not present

## 2020-05-12 DIAGNOSIS — F419 Anxiety disorder, unspecified: Secondary | ICD-10-CM | POA: Diagnosis not present

## 2020-05-12 DIAGNOSIS — G2581 Restless legs syndrome: Secondary | ICD-10-CM | POA: Diagnosis not present

## 2020-05-12 DIAGNOSIS — J439 Emphysema, unspecified: Secondary | ICD-10-CM | POA: Diagnosis not present

## 2020-05-12 DIAGNOSIS — F17210 Nicotine dependence, cigarettes, uncomplicated: Secondary | ICD-10-CM | POA: Diagnosis not present

## 2020-05-12 DIAGNOSIS — G4700 Insomnia, unspecified: Secondary | ICD-10-CM | POA: Diagnosis not present

## 2020-05-12 DIAGNOSIS — I69354 Hemiplegia and hemiparesis following cerebral infarction affecting left non-dominant side: Secondary | ICD-10-CM | POA: Diagnosis not present

## 2020-05-12 DIAGNOSIS — I2510 Atherosclerotic heart disease of native coronary artery without angina pectoris: Secondary | ICD-10-CM | POA: Diagnosis not present

## 2020-05-12 DIAGNOSIS — I10 Essential (primary) hypertension: Secondary | ICD-10-CM | POA: Diagnosis not present

## 2020-05-12 DIAGNOSIS — E785 Hyperlipidemia, unspecified: Secondary | ICD-10-CM | POA: Diagnosis not present

## 2020-05-14 DIAGNOSIS — F17210 Nicotine dependence, cigarettes, uncomplicated: Secondary | ICD-10-CM | POA: Diagnosis not present

## 2020-05-14 DIAGNOSIS — I69354 Hemiplegia and hemiparesis following cerebral infarction affecting left non-dominant side: Secondary | ICD-10-CM | POA: Diagnosis not present

## 2020-05-14 DIAGNOSIS — E785 Hyperlipidemia, unspecified: Secondary | ICD-10-CM | POA: Diagnosis not present

## 2020-05-14 DIAGNOSIS — I10 Essential (primary) hypertension: Secondary | ICD-10-CM | POA: Diagnosis not present

## 2020-05-14 DIAGNOSIS — J439 Emphysema, unspecified: Secondary | ICD-10-CM | POA: Diagnosis not present

## 2020-05-14 DIAGNOSIS — I2510 Atherosclerotic heart disease of native coronary artery without angina pectoris: Secondary | ICD-10-CM | POA: Diagnosis not present

## 2020-05-14 DIAGNOSIS — G4700 Insomnia, unspecified: Secondary | ICD-10-CM | POA: Diagnosis not present

## 2020-05-14 DIAGNOSIS — G2581 Restless legs syndrome: Secondary | ICD-10-CM | POA: Diagnosis not present

## 2020-05-14 DIAGNOSIS — F419 Anxiety disorder, unspecified: Secondary | ICD-10-CM | POA: Diagnosis not present

## 2020-05-17 DIAGNOSIS — I69354 Hemiplegia and hemiparesis following cerebral infarction affecting left non-dominant side: Secondary | ICD-10-CM | POA: Diagnosis not present

## 2020-05-17 DIAGNOSIS — G4700 Insomnia, unspecified: Secondary | ICD-10-CM | POA: Diagnosis not present

## 2020-05-17 DIAGNOSIS — E785 Hyperlipidemia, unspecified: Secondary | ICD-10-CM | POA: Diagnosis not present

## 2020-05-17 DIAGNOSIS — F17210 Nicotine dependence, cigarettes, uncomplicated: Secondary | ICD-10-CM | POA: Diagnosis not present

## 2020-05-17 DIAGNOSIS — I2510 Atherosclerotic heart disease of native coronary artery without angina pectoris: Secondary | ICD-10-CM | POA: Diagnosis not present

## 2020-05-17 DIAGNOSIS — J439 Emphysema, unspecified: Secondary | ICD-10-CM | POA: Diagnosis not present

## 2020-05-17 DIAGNOSIS — F419 Anxiety disorder, unspecified: Secondary | ICD-10-CM | POA: Diagnosis not present

## 2020-05-17 DIAGNOSIS — G2581 Restless legs syndrome: Secondary | ICD-10-CM | POA: Diagnosis not present

## 2020-05-17 DIAGNOSIS — I10 Essential (primary) hypertension: Secondary | ICD-10-CM | POA: Diagnosis not present

## 2020-05-18 DIAGNOSIS — K5900 Constipation, unspecified: Secondary | ICD-10-CM | POA: Diagnosis not present

## 2020-05-19 DIAGNOSIS — F17210 Nicotine dependence, cigarettes, uncomplicated: Secondary | ICD-10-CM | POA: Diagnosis not present

## 2020-05-19 DIAGNOSIS — G4700 Insomnia, unspecified: Secondary | ICD-10-CM | POA: Diagnosis not present

## 2020-05-19 DIAGNOSIS — I10 Essential (primary) hypertension: Secondary | ICD-10-CM | POA: Diagnosis not present

## 2020-05-19 DIAGNOSIS — J439 Emphysema, unspecified: Secondary | ICD-10-CM | POA: Diagnosis not present

## 2020-05-19 DIAGNOSIS — E785 Hyperlipidemia, unspecified: Secondary | ICD-10-CM | POA: Diagnosis not present

## 2020-05-19 DIAGNOSIS — I2510 Atherosclerotic heart disease of native coronary artery without angina pectoris: Secondary | ICD-10-CM | POA: Diagnosis not present

## 2020-05-19 DIAGNOSIS — G2581 Restless legs syndrome: Secondary | ICD-10-CM | POA: Diagnosis not present

## 2020-05-19 DIAGNOSIS — F419 Anxiety disorder, unspecified: Secondary | ICD-10-CM | POA: Diagnosis not present

## 2020-05-19 DIAGNOSIS — I69354 Hemiplegia and hemiparesis following cerebral infarction affecting left non-dominant side: Secondary | ICD-10-CM | POA: Diagnosis not present

## 2020-05-23 DIAGNOSIS — F419 Anxiety disorder, unspecified: Secondary | ICD-10-CM | POA: Diagnosis not present

## 2020-05-23 DIAGNOSIS — E785 Hyperlipidemia, unspecified: Secondary | ICD-10-CM | POA: Diagnosis not present

## 2020-05-23 DIAGNOSIS — I10 Essential (primary) hypertension: Secondary | ICD-10-CM | POA: Diagnosis not present

## 2020-05-23 DIAGNOSIS — G4700 Insomnia, unspecified: Secondary | ICD-10-CM | POA: Diagnosis not present

## 2020-05-23 DIAGNOSIS — F17210 Nicotine dependence, cigarettes, uncomplicated: Secondary | ICD-10-CM | POA: Diagnosis not present

## 2020-05-23 DIAGNOSIS — I2510 Atherosclerotic heart disease of native coronary artery without angina pectoris: Secondary | ICD-10-CM | POA: Diagnosis not present

## 2020-05-23 DIAGNOSIS — J439 Emphysema, unspecified: Secondary | ICD-10-CM | POA: Diagnosis not present

## 2020-05-23 DIAGNOSIS — G2581 Restless legs syndrome: Secondary | ICD-10-CM | POA: Diagnosis not present

## 2020-05-23 DIAGNOSIS — I69354 Hemiplegia and hemiparesis following cerebral infarction affecting left non-dominant side: Secondary | ICD-10-CM | POA: Diagnosis not present

## 2020-05-24 DIAGNOSIS — G4700 Insomnia, unspecified: Secondary | ICD-10-CM | POA: Diagnosis not present

## 2020-05-24 DIAGNOSIS — G2581 Restless legs syndrome: Secondary | ICD-10-CM | POA: Diagnosis not present

## 2020-05-24 DIAGNOSIS — I2510 Atherosclerotic heart disease of native coronary artery without angina pectoris: Secondary | ICD-10-CM | POA: Diagnosis not present

## 2020-05-24 DIAGNOSIS — I69354 Hemiplegia and hemiparesis following cerebral infarction affecting left non-dominant side: Secondary | ICD-10-CM | POA: Diagnosis not present

## 2020-05-24 DIAGNOSIS — F17210 Nicotine dependence, cigarettes, uncomplicated: Secondary | ICD-10-CM | POA: Diagnosis not present

## 2020-05-24 DIAGNOSIS — I10 Essential (primary) hypertension: Secondary | ICD-10-CM | POA: Diagnosis not present

## 2020-05-24 DIAGNOSIS — J439 Emphysema, unspecified: Secondary | ICD-10-CM | POA: Diagnosis not present

## 2020-05-24 DIAGNOSIS — F419 Anxiety disorder, unspecified: Secondary | ICD-10-CM | POA: Diagnosis not present

## 2020-05-24 DIAGNOSIS — E785 Hyperlipidemia, unspecified: Secondary | ICD-10-CM | POA: Diagnosis not present

## 2020-05-26 DIAGNOSIS — G4700 Insomnia, unspecified: Secondary | ICD-10-CM | POA: Diagnosis not present

## 2020-05-26 DIAGNOSIS — G2581 Restless legs syndrome: Secondary | ICD-10-CM | POA: Diagnosis not present

## 2020-05-26 DIAGNOSIS — F419 Anxiety disorder, unspecified: Secondary | ICD-10-CM | POA: Diagnosis not present

## 2020-05-26 DIAGNOSIS — J439 Emphysema, unspecified: Secondary | ICD-10-CM | POA: Diagnosis not present

## 2020-05-26 DIAGNOSIS — F17210 Nicotine dependence, cigarettes, uncomplicated: Secondary | ICD-10-CM | POA: Diagnosis not present

## 2020-05-26 DIAGNOSIS — I2510 Atherosclerotic heart disease of native coronary artery without angina pectoris: Secondary | ICD-10-CM | POA: Diagnosis not present

## 2020-05-26 DIAGNOSIS — I69354 Hemiplegia and hemiparesis following cerebral infarction affecting left non-dominant side: Secondary | ICD-10-CM | POA: Diagnosis not present

## 2020-05-26 DIAGNOSIS — E785 Hyperlipidemia, unspecified: Secondary | ICD-10-CM | POA: Diagnosis not present

## 2020-05-26 DIAGNOSIS — I10 Essential (primary) hypertension: Secondary | ICD-10-CM | POA: Diagnosis not present

## 2020-05-27 DIAGNOSIS — E785 Hyperlipidemia, unspecified: Secondary | ICD-10-CM | POA: Diagnosis not present

## 2020-05-27 DIAGNOSIS — I10 Essential (primary) hypertension: Secondary | ICD-10-CM | POA: Diagnosis not present

## 2020-05-27 DIAGNOSIS — G4700 Insomnia, unspecified: Secondary | ICD-10-CM | POA: Diagnosis not present

## 2020-05-27 DIAGNOSIS — J439 Emphysema, unspecified: Secondary | ICD-10-CM | POA: Diagnosis not present

## 2020-05-27 DIAGNOSIS — I69354 Hemiplegia and hemiparesis following cerebral infarction affecting left non-dominant side: Secondary | ICD-10-CM | POA: Diagnosis not present

## 2020-05-27 DIAGNOSIS — F419 Anxiety disorder, unspecified: Secondary | ICD-10-CM | POA: Diagnosis not present

## 2020-05-27 DIAGNOSIS — I2510 Atherosclerotic heart disease of native coronary artery without angina pectoris: Secondary | ICD-10-CM | POA: Diagnosis not present

## 2020-05-27 DIAGNOSIS — F17210 Nicotine dependence, cigarettes, uncomplicated: Secondary | ICD-10-CM | POA: Diagnosis not present

## 2020-05-27 DIAGNOSIS — G2581 Restless legs syndrome: Secondary | ICD-10-CM | POA: Diagnosis not present

## 2020-05-31 DIAGNOSIS — G4700 Insomnia, unspecified: Secondary | ICD-10-CM | POA: Diagnosis not present

## 2020-05-31 DIAGNOSIS — I2510 Atherosclerotic heart disease of native coronary artery without angina pectoris: Secondary | ICD-10-CM | POA: Diagnosis not present

## 2020-05-31 DIAGNOSIS — F419 Anxiety disorder, unspecified: Secondary | ICD-10-CM | POA: Diagnosis not present

## 2020-05-31 DIAGNOSIS — I10 Essential (primary) hypertension: Secondary | ICD-10-CM | POA: Diagnosis not present

## 2020-05-31 DIAGNOSIS — J439 Emphysema, unspecified: Secondary | ICD-10-CM | POA: Diagnosis not present

## 2020-05-31 DIAGNOSIS — G2581 Restless legs syndrome: Secondary | ICD-10-CM | POA: Diagnosis not present

## 2020-05-31 DIAGNOSIS — I69354 Hemiplegia and hemiparesis following cerebral infarction affecting left non-dominant side: Secondary | ICD-10-CM | POA: Diagnosis not present

## 2020-05-31 DIAGNOSIS — F17210 Nicotine dependence, cigarettes, uncomplicated: Secondary | ICD-10-CM | POA: Diagnosis not present

## 2020-05-31 DIAGNOSIS — E785 Hyperlipidemia, unspecified: Secondary | ICD-10-CM | POA: Diagnosis not present

## 2020-06-01 DIAGNOSIS — I69354 Hemiplegia and hemiparesis following cerebral infarction affecting left non-dominant side: Secondary | ICD-10-CM | POA: Diagnosis not present

## 2020-06-01 DIAGNOSIS — G4700 Insomnia, unspecified: Secondary | ICD-10-CM | POA: Diagnosis not present

## 2020-06-01 DIAGNOSIS — E785 Hyperlipidemia, unspecified: Secondary | ICD-10-CM | POA: Diagnosis not present

## 2020-06-01 DIAGNOSIS — G2581 Restless legs syndrome: Secondary | ICD-10-CM | POA: Diagnosis not present

## 2020-06-01 DIAGNOSIS — J439 Emphysema, unspecified: Secondary | ICD-10-CM | POA: Diagnosis not present

## 2020-06-01 DIAGNOSIS — I10 Essential (primary) hypertension: Secondary | ICD-10-CM | POA: Diagnosis not present

## 2020-06-01 DIAGNOSIS — F17210 Nicotine dependence, cigarettes, uncomplicated: Secondary | ICD-10-CM | POA: Diagnosis not present

## 2020-06-01 DIAGNOSIS — F419 Anxiety disorder, unspecified: Secondary | ICD-10-CM | POA: Diagnosis not present

## 2020-06-01 DIAGNOSIS — I2510 Atherosclerotic heart disease of native coronary artery without angina pectoris: Secondary | ICD-10-CM | POA: Diagnosis not present

## 2020-06-02 DIAGNOSIS — R399 Unspecified symptoms and signs involving the genitourinary system: Secondary | ICD-10-CM | POA: Diagnosis not present

## 2020-06-02 DIAGNOSIS — G4700 Insomnia, unspecified: Secondary | ICD-10-CM | POA: Diagnosis not present

## 2020-06-02 DIAGNOSIS — W19XXXA Unspecified fall, initial encounter: Secondary | ICD-10-CM | POA: Diagnosis not present

## 2020-06-02 DIAGNOSIS — R350 Frequency of micturition: Secondary | ICD-10-CM | POA: Diagnosis not present

## 2020-06-02 DIAGNOSIS — R300 Dysuria: Secondary | ICD-10-CM | POA: Diagnosis not present

## 2020-06-02 DIAGNOSIS — I10 Essential (primary) hypertension: Secondary | ICD-10-CM | POA: Diagnosis not present

## 2020-06-02 DIAGNOSIS — J449 Chronic obstructive pulmonary disease, unspecified: Secondary | ICD-10-CM | POA: Diagnosis not present

## 2020-06-02 DIAGNOSIS — F322 Major depressive disorder, single episode, severe without psychotic features: Secondary | ICD-10-CM | POA: Diagnosis not present

## 2020-06-02 DIAGNOSIS — I639 Cerebral infarction, unspecified: Secondary | ICD-10-CM | POA: Diagnosis not present

## 2020-06-07 DIAGNOSIS — I63239 Cerebral infarction due to unspecified occlusion or stenosis of unspecified carotid arteries: Secondary | ICD-10-CM | POA: Diagnosis not present

## 2020-06-07 DIAGNOSIS — Z720 Tobacco use: Secondary | ICD-10-CM | POA: Diagnosis not present

## 2020-06-10 DIAGNOSIS — I10 Essential (primary) hypertension: Secondary | ICD-10-CM | POA: Diagnosis not present

## 2020-06-10 DIAGNOSIS — I69354 Hemiplegia and hemiparesis following cerebral infarction affecting left non-dominant side: Secondary | ICD-10-CM | POA: Diagnosis not present

## 2020-06-10 DIAGNOSIS — J439 Emphysema, unspecified: Secondary | ICD-10-CM | POA: Diagnosis not present

## 2020-06-10 DIAGNOSIS — E785 Hyperlipidemia, unspecified: Secondary | ICD-10-CM | POA: Diagnosis not present

## 2020-06-10 DIAGNOSIS — G4700 Insomnia, unspecified: Secondary | ICD-10-CM | POA: Diagnosis not present

## 2020-06-10 DIAGNOSIS — F17210 Nicotine dependence, cigarettes, uncomplicated: Secondary | ICD-10-CM | POA: Diagnosis not present

## 2020-06-10 DIAGNOSIS — F419 Anxiety disorder, unspecified: Secondary | ICD-10-CM | POA: Diagnosis not present

## 2020-06-10 DIAGNOSIS — G2581 Restless legs syndrome: Secondary | ICD-10-CM | POA: Diagnosis not present

## 2020-06-10 DIAGNOSIS — I2510 Atherosclerotic heart disease of native coronary artery without angina pectoris: Secondary | ICD-10-CM | POA: Diagnosis not present

## 2020-06-22 DIAGNOSIS — I63239 Cerebral infarction due to unspecified occlusion or stenosis of unspecified carotid arteries: Secondary | ICD-10-CM | POA: Diagnosis not present

## 2020-06-22 DIAGNOSIS — I10 Essential (primary) hypertension: Secondary | ICD-10-CM | POA: Diagnosis not present

## 2020-06-22 DIAGNOSIS — I25119 Atherosclerotic heart disease of native coronary artery with unspecified angina pectoris: Secondary | ICD-10-CM | POA: Diagnosis not present

## 2020-08-24 DIAGNOSIS — H5203 Hypermetropia, bilateral: Secondary | ICD-10-CM | POA: Diagnosis not present

## 2020-09-21 DIAGNOSIS — R0981 Nasal congestion: Secondary | ICD-10-CM | POA: Diagnosis not present

## 2020-09-21 DIAGNOSIS — R112 Nausea with vomiting, unspecified: Secondary | ICD-10-CM | POA: Diagnosis not present

## 2020-09-21 DIAGNOSIS — R059 Cough, unspecified: Secondary | ICD-10-CM | POA: Diagnosis not present

## 2020-09-21 DIAGNOSIS — J441 Chronic obstructive pulmonary disease with (acute) exacerbation: Secondary | ICD-10-CM | POA: Diagnosis not present

## 2020-09-21 DIAGNOSIS — R197 Diarrhea, unspecified: Secondary | ICD-10-CM | POA: Diagnosis not present

## 2020-10-01 DIAGNOSIS — Z23 Encounter for immunization: Secondary | ICD-10-CM | POA: Diagnosis not present

## 2020-10-01 DIAGNOSIS — U071 COVID-19: Secondary | ICD-10-CM | POA: Diagnosis not present

## 2020-10-26 DIAGNOSIS — F331 Major depressive disorder, recurrent, moderate: Secondary | ICD-10-CM | POA: Diagnosis not present

## 2020-11-01 DIAGNOSIS — F329 Major depressive disorder, single episode, unspecified: Secondary | ICD-10-CM | POA: Diagnosis not present

## 2020-11-03 DIAGNOSIS — F4310 Post-traumatic stress disorder, unspecified: Secondary | ICD-10-CM | POA: Diagnosis not present

## 2023-07-16 ENCOUNTER — Emergency Department (HOSPITAL_COMMUNITY): Payer: 59

## 2023-07-16 ENCOUNTER — Encounter (HOSPITAL_COMMUNITY): Payer: Self-pay | Admitting: *Deleted

## 2023-07-16 ENCOUNTER — Emergency Department (HOSPITAL_COMMUNITY)
Admission: EM | Admit: 2023-07-16 | Discharge: 2023-07-16 | Disposition: A | Discharge status: Home / Self Care | Payer: 59 | Attending: Emergency Medicine | Admitting: Emergency Medicine

## 2023-07-16 ENCOUNTER — Other Ambulatory Visit: Payer: Self-pay

## 2023-07-16 DIAGNOSIS — I10 Essential (primary) hypertension: Secondary | ICD-10-CM | POA: Diagnosis not present

## 2023-07-16 DIAGNOSIS — R112 Nausea with vomiting, unspecified: Secondary | ICD-10-CM

## 2023-07-16 DIAGNOSIS — N132 Hydronephrosis with renal and ureteral calculous obstruction: Secondary | ICD-10-CM | POA: Insufficient documentation

## 2023-07-16 DIAGNOSIS — J449 Chronic obstructive pulmonary disease, unspecified: Secondary | ICD-10-CM | POA: Diagnosis not present

## 2023-07-16 DIAGNOSIS — N200 Calculus of kidney: Secondary | ICD-10-CM

## 2023-07-16 DIAGNOSIS — R1031 Right lower quadrant pain: Secondary | ICD-10-CM | POA: Diagnosis present

## 2023-07-16 LAB — URINALYSIS, ROUTINE W REFLEX MICROSCOPIC
Bacteria, UA: NONE SEEN
Bilirubin Urine: NEGATIVE
Glucose, UA: NEGATIVE mg/dL
Ketones, ur: 20 mg/dL — AB
Leukocytes,Ua: NEGATIVE
Nitrite: NEGATIVE
Protein, ur: NEGATIVE mg/dL
Specific Gravity, Urine: 1.031 — ABNORMAL HIGH (ref 1.005–1.030)
pH: 7 (ref 5.0–8.0)

## 2023-07-16 LAB — LIPASE, BLOOD: Lipase: 32 U/L (ref 11–51)

## 2023-07-16 LAB — CBC
HCT: 41.6 % (ref 36.0–46.0)
Hemoglobin: 13.9 g/dL (ref 12.0–15.0)
MCH: 29.3 pg (ref 26.0–34.0)
MCHC: 33.4 g/dL (ref 30.0–36.0)
MCV: 87.6 fL (ref 80.0–100.0)
Platelets: 360 10*3/uL (ref 150–400)
RBC: 4.75 MIL/uL (ref 3.87–5.11)
RDW: 13.3 % (ref 11.5–15.5)
WBC: 18.1 10*3/uL — ABNORMAL HIGH (ref 4.0–10.5)
nRBC: 0 % (ref 0.0–0.2)

## 2023-07-16 LAB — COMPREHENSIVE METABOLIC PANEL
ALT: 29 U/L (ref 0–44)
AST: 21 U/L (ref 15–41)
Albumin: 3.7 g/dL (ref 3.5–5.0)
Alkaline Phosphatase: 108 U/L (ref 38–126)
Anion gap: 8 (ref 5–15)
BUN: 23 mg/dL (ref 8–23)
CO2: 21 mmol/L — ABNORMAL LOW (ref 22–32)
Calcium: 8.9 mg/dL (ref 8.9–10.3)
Chloride: 107 mmol/L (ref 98–111)
Creatinine, Ser: 0.95 mg/dL (ref 0.44–1.00)
GFR, Estimated: >60 mL/min (ref >60)
Glucose, Bld: 143 mg/dL — ABNORMAL HIGH (ref 70–99)
Potassium: 3.5 mmol/L (ref 3.5–5.1)
Sodium: 136 mmol/L (ref 135–145)
Total Bilirubin: 0.6 mg/dL (ref 0.3–1.2)
Total Protein: 7.1 g/dL (ref 6.5–8.1)

## 2023-07-16 MED ORDER — KETOROLAC TROMETHAMINE 15 MG/ML IJ SOLN
7.5000 mg | Freq: Once | INTRAMUSCULAR | Status: AC
  Administered 2023-07-16: 7.5 mg via INTRAVENOUS
  Filled 2023-07-16: qty 1

## 2023-07-16 MED ORDER — CEPHALEXIN 500 MG PO CAPS: 500.0000 mg | ORAL_CAPSULE | Freq: Two times a day (BID) | ORAL | 0 refills | Status: AC

## 2023-07-16 MED ORDER — MORPHINE SULFATE (PF) 4 MG/ML IV SOLN
4.0000 mg | Freq: Once | INTRAVENOUS | Status: AC
  Administered 2023-07-16: 4 mg via INTRAVENOUS
  Filled 2023-07-16: qty 1

## 2023-07-16 MED ORDER — LACTATED RINGERS IV BOLUS
1000.0000 mL | Freq: Once | INTRAVENOUS | Status: AC
  Administered 2023-07-16: 1000 mL via INTRAVENOUS

## 2023-07-16 MED ORDER — CEPHALEXIN 500 MG PO CAPS: 500.0000 mg | ORAL_CAPSULE | Freq: Two times a day (BID) | ORAL | 0 refills | Status: DC

## 2023-07-16 MED ORDER — TAMSULOSIN HCL 0.4 MG PO CAPS: 0.4000 mg | ORAL_CAPSULE | Freq: Every day | ORAL | 0 refills | Status: AC

## 2023-07-16 MED ORDER — IOHEXOL 300 MG/ML  SOLN
80.0000 mL | Freq: Once | INTRAMUSCULAR | Status: AC | PRN
  Administered 2023-07-16: 80 mL via INTRAVENOUS

## 2023-07-16 MED ORDER — ACETAMINOPHEN 500 MG PO TABS
1000.0000 mg | ORAL_TABLET | Freq: Once | ORAL | Status: AC
  Administered 2023-07-16: 1000 mg via ORAL
  Filled 2023-07-16: qty 2

## 2023-07-16 MED ORDER — TAMSULOSIN HCL 0.4 MG PO CAPS
0.4000 mg | ORAL_CAPSULE | ORAL | Status: AC
  Administered 2023-07-16: 0.4 mg via ORAL
  Filled 2023-07-16: qty 1

## 2023-07-16 MED ORDER — OXYCODONE HCL 5 MG PO TABS: 5.0000 mg | ORAL_TABLET | ORAL | 0 refills | Status: AC | PRN

## 2023-07-16 MED ORDER — SODIUM CHLORIDE 0.9 % IV SOLN
1.0000 g | Freq: Once | INTRAVENOUS | Status: AC
  Administered 2023-07-16: 1 g via INTRAVENOUS
  Filled 2023-07-16: qty 10

## 2023-07-16 MED ORDER — ONDANSETRON HCL 4 MG/2ML IJ SOLN
4.0000 mg | Freq: Once | INTRAMUSCULAR | Status: AC
  Administered 2023-07-16: 4 mg via INTRAVENOUS
  Filled 2023-07-16: qty 2

## 2023-07-16 NOTE — ED Triage Notes (Signed)
Pt BIB [facility] for RLQ since 1400 today. Pt states she has emesis x 10-15.

## 2023-07-16 NOTE — ED Notes (Signed)
Pt ambulated to bathroom without assistance 

## 2023-07-16 NOTE — ED Provider Notes (Signed)
[place] EMERGENCY DEPARTMENT AT [facility] Provider Note   CSN: [id number] Arrival date & time: 07/16/23  1704     History {Add pertinent medical, surgical, social history, OB history to HPI:1} Chief Complaint  Patient presents with   Abdominal Pain    Olivia Manning is a 62 y.o. female.  62 year old female with a history of stroke with residual left upper extremity weakness, COPD, hypertension, and hyperlipidemia who presents to the emergency department with right lower quadrant abdominal pain.  Says that this started abruptly at 2 PM today.  Says that it has been coming in waves.  Worsened with movement.  No fevers but has had over 10 episodes of nonbloody nonbilious emesis.  No diarrhea.  Only abdominal surgeries are cholecystectomy and hysterectomy.  No dysuria or frequency.       Home Medications Prior to Admission medications   Medication Sig Start Date End Date Taking? Authorizing Provider  albuterol (PROVENTIL) (2.5 MG/3ML) 0.083% nebulizer solution Take 2.5 mg by nebulization 3 (three) times daily.    [provider]  ALPRAZolam Prudy Feeler) 1 MG tablet Take 1 mg by mouth 4 (four) times daily.    [provider]  amoxicillin (AMOXIL) 500 MG capsule  02/16/20   [provider]  amoxicillin-clavulanate (AUGMENTIN) 875-125 MG tablet Take 1 tablet by mouth 2 (two) times daily. 02/05/20   [provider]  atorvastatin (LIPITOR) 40 MG tablet Take 40 mg by mouth daily. 01/27/20   [provider]  budesonide-formoterol (SYMBICORT) 160-4.5 MCG/ACT inhaler SMARTSIG:2 Puff(s) By Mouth Twice Daily 01/22/20   [provider]  DULoxetine (CYMBALTA) 30 MG capsule Take 30 mg by mouth daily. 02/05/20   [provider]  HYDROcodone-acetaminophen (NORCO/VICODIN) 5-325 MG per tablet Take 1-2 tablets by mouth every 6 (six) hours as needed for pain. 07/26/13   Bonk, Orson Aloe, MD  hydrOXYzine (ATARAX/VISTARIL) 25 MG tablet Take 25 mg  by mouth 3 (three) times daily as needed. 01/22/20   [provider]  ibuprofen (ADVIL) 800 MG tablet  02/16/20   [provider]  ibuprofen (ADVIL,MOTRIN) 600 MG tablet Take 1 tablet (600 mg total) by mouth every 6 (six) hours as needed for pain. 04/04/13   Burgess Amor, PA-C  ipratropium (ATROVENT) 0.02 % nebulizer solution Take 500 mcg by nebulization at bedtime.    [provider]  LINZESS 290 MCG CAPS capsule Take 290 mcg by mouth daily. 01/27/20   [provider]  meloxicam (MOBIC) 7.5 MG tablet Take 7.5 mg by mouth daily. 02/03/20   [provider]  nitroGLYCERIN (NITROSTAT) 0.4 MG SL tablet Place 0.4 mg under the tongue every 5 (five) minutes x 3 doses as needed for chest pain.    [provider]      Allergies    Codeine and Adhesive [tape]    Review of Systems   Review of Systems  Physical Exam Updated Vital Signs BP (!) 184/96 (BP Location: Left Arm)   Pulse 64   Temp 97.6 F (36.4 C) (Oral)   Resp 18   Ht 5\' 3"  (1.6 m)   SpO2 100%   BMI 27.10 kg/m  Physical Exam Vitals and nursing note reviewed.  Constitutional:      General: She is not in acute distress.    Appearance: She is well-developed.  HENT:     Head: Normocephalic and atraumatic.     Right Ear: External ear normal.     Left Ear: External ear normal.  Nose: Nose normal.  Eyes:     Extraocular Movements: Extraocular movements intact.     Conjunctiva/sclera: Conjunctivae normal.     Pupils: Pupils are equal, round, and reactive to light.  Cardiovascular:     Rate and Rhythm: Normal rate and regular rhythm.  Pulmonary:     Effort: Pulmonary effort is normal.     Breath sounds: Normal breath sounds.  Abdominal:     General: Abdomen is flat. There is no distension.     Palpations: Abdomen is soft. There is no mass.     Tenderness: There is abdominal tenderness (RLQ). There is no guarding.  Musculoskeletal:     Cervical back: Normal range of motion and  neck supple.  Skin:    General: Skin is warm and dry.  Neurological:     Mental Status: She is alert and oriented to person, place, and time. Mental status is at baseline.  Psychiatric:        Mood and Affect: Mood normal.     ED Results / Procedures / Treatments   Labs (all labs ordered are listed, but only abnormal results are displayed) Labs Reviewed  COMPREHENSIVE METABOLIC PANEL - Abnormal; Notable for the following components:      Result Value   CO2 21 (*)    Glucose, Bld 143 (*)    All other components within normal limits  CBC - Abnormal; Notable for the following components:   WBC 18.1 (*)    All other components within normal limits  LIPASE, BLOOD  URINALYSIS, ROUTINE W REFLEX MICROSCOPIC    EKG None  Radiology No results found.  Procedures Procedures  {Document cardiac monitor, telemetry assessment procedure when appropriate:1}  Medications Ordered in ED Medications  morphine (PF) 4 MG/ML injection 4 mg (has no administration in time range)  ondansetron (ZOFRAN) injection 4 mg (has no administration in time range)  lactated ringers bolus 1,000 mL (has no administration in time range)    ED Course/ Medical Decision Making/ A&P   {   Click here for ABCD2, HEART and other calculatorsREFRESH Note before signing :1}                              Medical Decision Making Amount and/or Complexity of Data Reviewed Labs: ordered. Radiology: ordered.  Risk Prescription drug management.   ***  {Document critical care time when appropriate:1} {Document review of labs and clinical decision tools ie heart score, Chads2Vasc2 etc:1}  {Document your independent review of radiology images, and any outside records:1} {Document your discussion with family members, caretakers, and with consultants:1} {Document social determinants of health affecting pt's care:1} {Document your decision making why or why not admission, treatments were needed:1} Final Clinical  Impression(s) / ED Diagnoses Final diagnoses:  None    Rx / DC Orders ED Discharge Orders     None

## 2023-07-16 NOTE — ED Notes (Signed)
Heating pads x 2 provided to patient to apply to her abdomen. Family member at bedside.

## 2023-07-16 NOTE — Discharge Instructions (Signed)
You were seen for your kidney stone in the emergency department.    At home, please take Tylenol and ibuprofen for your pain. You may also take the oxycodone we have prescribed you for any breakthrough pain that may have.  Do not take this before driving or operating heavy machinery.  Do not take this medication with alcohol.  Use the flowmax we have give you every day until the kidney stone passes. Please also strain your urine to collect the kidney stone. Store it in a container and take it to your urologist for analysis.   Follow-up with urology in a week to discuss your symptoms.   Return immediately to the emergency department if you experience any of the following: fever, unbearable pain, urinary retention, or any other concerning symptoms.    Thank you for visiting our Emergency Department. It was a pleasure taking care of you today.   

## 2023-07-16 NOTE — ED Notes (Signed)
Patient transported to CT via stretcher. Had recent episodes of emesis.

## 2023-07-18 ENCOUNTER — Other Ambulatory Visit: Payer: Self-pay | Admitting: Urology

## 2023-07-18 ENCOUNTER — Ambulatory Visit (HOSPITAL_COMMUNITY)
Admission: RE | Admit: 2023-07-18 | Discharge: 2023-07-18 | Disposition: A | Discharge status: Home / Self Care | Payer: 59 | Source: Ambulatory Visit | Attending: Urology | Admitting: Urology

## 2023-07-18 ENCOUNTER — Ambulatory Visit (INDEPENDENT_AMBULATORY_CARE_PROVIDER_SITE_OTHER): Payer: 59 | Admitting: Urology

## 2023-07-18 ENCOUNTER — Encounter: Payer: Self-pay | Admitting: Urology

## 2023-07-18 ENCOUNTER — Ambulatory Visit: Payer: Self-pay | Admitting: Urology

## 2023-07-18 VITALS — BP 99/67 | Ht 63.0 in

## 2023-07-18 DIAGNOSIS — N201 Calculus of ureter: Secondary | ICD-10-CM

## 2023-07-18 DIAGNOSIS — R109 Unspecified abdominal pain: Secondary | ICD-10-CM

## 2023-07-18 DIAGNOSIS — N132 Hydronephrosis with renal and ureteral calculous obstruction: Secondary | ICD-10-CM

## 2023-07-18 DIAGNOSIS — R112 Nausea with vomiting, unspecified: Secondary | ICD-10-CM

## 2023-07-18 MED ORDER — HYDROCODONE-ACETAMINOPHEN 5-325 MG PO TABS: 1.0000 | ORAL_TABLET | Freq: Four times a day (QID) | ORAL | 0 refills | Status: AC | PRN

## 2023-07-18 MED ORDER — KETOROLAC TROMETHAMINE 60 MG/2ML IM SOLN
60.0000 mg | Freq: Once | INTRAMUSCULAR | Status: AC
Start: 2023-07-18 — End: 2023-07-18
  Administered 2023-07-18: 60 mg via INTRAMUSCULAR

## 2023-07-18 MED ORDER — ONDANSETRON HCL 4 MG/2ML IJ SOLN
4.0000 mg | Freq: Once | INTRAMUSCULAR | Status: AC
Start: 2023-07-18 — End: ?

## 2023-07-18 MED ORDER — ONDANSETRON HCL 4 MG PO TABS: 4.0000 mg | ORAL_TABLET | Freq: Three times a day (TID) | ORAL | 0 refills | Status: AC | PRN

## 2023-07-18 NOTE — Progress Notes (Signed)
Toradol injection  Medication: Toradol Dose: 60 Location: left Lot: [id number] Exp: 07/2024  Patient tolerated well, no complications were noted  Performed by: Guss Bunde, CMA

## 2023-07-18 NOTE — Progress Notes (Signed)
Subjective: 1. Ureteral stone   2. Hydronephrosis with urinary obstruction due to ureteral calculus   3. Flank pain   4. Nausea and vomiting, unspecified vomiting type     Consult requested by [facility]  Olivia Manning is a 62 yo female who had the onset Tuesday of severe right flank pain with N/V.   She has some urgency with frequent small voids.  She has had no hematuria.  She had a CT in the ER that showed at least 2 distal stones with obstruction on the right with obstruction.  A KUB today still shows the stones.  She had only RBC's on the UA in the ER and normal chemistries.  She remains in pain and last took pain med without response at 11am.   ROS:  Review of Systems  Respiratory:  Positive for shortness of breath.   Neurological:  Positive for weakness.    Allergies  Allergen Reactions   Codeine Shortness Of Breath   Adhesive [Tape] Rash    Past Medical History:  Diagnosis Date   Anxiety    COPD (chronic obstructive pulmonary disease) (HCC)    H/O angioplasty    Hypertension    Panic attacks    Stroke [facility])     Past Surgical History:  Procedure Laterality Date   ABDOMINAL HYSTERECTOMY     CHOLECYSTECTOMY     KNEE ARTHROSCOPY     MIDDLE EAR SURGERY     TUBAL LIGATION      Social History   Socioeconomic History   Marital status: Divorced    Spouse name: Not on file   Number of children: Not on file   Years of education: Not on file   Highest education level: Not on file  Occupational History   Not on file  Tobacco Use   Smoking status: Former    Current packs/day: 0.00    Types: Cigarettes    Quit date: 01/29/2020    Years since quitting: 3.4   Smokeless tobacco: Former    Quit date: 02/06/2012  Substance and Sexual Activity   Alcohol use: No    Comment: occ   Drug use: Not Currently    Types: Marijuana   Sexual activity: Yes    Birth control/protection: Surgical  Other Topics Concern   Not on file  Social History Narrative   Not on file   Social  Determinants of Health   Financial Resource Strain: Low Risk  (07/31/2022)   Received from [facility], [facility]   Overall Financial Resource Strain (CARDIA)    Difficulty of Paying Living Expenses: Not hard at all  Food Insecurity: No Food Insecurity (07/31/2022)   Received from [facility], [facility]   Hunger Vital Sign    Worried About Running Out of Food in the Last Year: Never true    Ran Out of Food in the Last Year: Never true  Transportation Needs: Unmet Transportation Needs (04/17/2021)   Received from [facility], [facility]   PRAPARE - Transportation    Lack of Transportation (Medical): Yes    Lack of Transportation (Non-Medical): No  Physical Activity: Unknown (07/31/2022)   Received from [facility], [facility]   Exercise Vital Sign    Days of Exercise per Week: 0 days    Minutes of Exercise per Session: Not on file  Stress: Stress Concern Present (07/31/2022)   Received from [facility], [facility] - Occupational Stress Questionnaire    Feeling of  Stress : Very much  Social Connections: Socially Isolated (07/31/2022)   Received from [facility], [facility]   Social Network    How would you rate your social network (family, work, friends)?: Little participation, lonely and socially isolated  Intimate Partner Violence: Not At Risk (07/08/2023)   Received from [facility]   HITS    Over the last 12 months how often did your partner physically hurt you?: 1    Over the last 12 months how often did your partner insult you or talk down to you?: 1    Over the last 12 months how often did your partner threaten you with physical harm?: 1    Over the last 12 months how often did your partner scream or curse at you?: 1    Family History  Problem Relation Age of Onset   Heart attack Father    Heart failure Father    Hypertension Father    Heart attack Brother     Anti-infectives: Anti-infectives  (From admission, onward)    None       Current Outpatient Medications  Medication Sig Dispense Refill   albuterol (PROVENTIL) (2.5 MG/3ML) 0.083% nebulizer solution Take 2.5 mg by nebulization 3 (three) times daily.     ALPRAZolam (XANAX) 1 MG tablet Take 1 mg by mouth 4 (four) times daily.     amoxicillin (AMOXIL) 500 MG capsule      amoxicillin-clavulanate (AUGMENTIN) 875-125 MG tablet Take 1 tablet by mouth 2 (two) times daily.     atorvastatin (LIPITOR) 40 MG tablet Take 40 mg by mouth daily.     budesonide-formoterol (SYMBICORT) 160-4.5 MCG/ACT inhaler SMARTSIG:2 Puff(s) By Mouth Twice Daily     cephALEXin (KEFLEX) 500 MG capsule Take 1 capsule (500 mg total) by mouth 2 (two) times daily for 10 days. 20 capsule 0   DULoxetine (CYMBALTA) 30 MG capsule Take 30 mg by mouth daily.     hydrOXYzine (ATARAX/VISTARIL) 25 MG tablet Take 25 mg by mouth 3 (three) times daily as needed.     ibuprofen (ADVIL) 800 MG tablet      ibuprofen (ADVIL,MOTRIN) 600 MG tablet Take 1 tablet (600 mg total) by mouth every 6 (six) hours as needed for pain. 20 tablet 0   ipratropium (ATROVENT) 0.02 % nebulizer solution Take 500 mcg by nebulization at bedtime.     LINZESS 290 MCG CAPS capsule Take 290 mcg by mouth daily.     meloxicam (MOBIC) 7.5 MG tablet Take 7.5 mg by mouth daily.     nitroGLYCERIN (NITROSTAT) 0.4 MG SL tablet Place 0.4 mg under the tongue every 5 (five) minutes x 3 doses as needed for chest pain.     ondansetron (ZOFRAN) 4 MG tablet Take 1 tablet (4 mg total) by mouth every 8 (eight) hours as needed for nausea or vomiting. 12 tablet 0   oxyCODONE (ROXICODONE) 5 MG immediate release tablet Take 1 tablet (5 mg total) by mouth every 4 (four) hours as needed for severe pain. 20 tablet 0   tamsulosin (FLOMAX) 0.4 MG CAPS capsule Take 1 capsule (0.4 mg total) by mouth daily after breakfast. 30 capsule 0   HYDROcodone-acetaminophen (NORCO/VICODIN) 5-325 MG tablet Take 1-2 tablets by mouth every 6  (six) hours as needed. 15 tablet 0   Current Facility-Administered Medications  Medication Dose Route Frequency Provider Last Rate Last Admin   ondansetron (ZOFRAN) injection 4 mg  4 mg Intramuscular Once          Objective:  Vital signs in last 24 hours: BP 99/67   Ht 5\' 3"  (1.6 m)   BMI 27.10 kg/m   Intake/Output from previous day: No intake/output data recorded. Intake/Output this shift: @IOTHISSHIFT @   Physical Exam Vitals reviewed.  Constitutional:      General: She is in acute distress.     Appearance: Normal appearance.  Cardiovascular:     Rate and Rhythm: Normal rate and regular rhythm.     Heart sounds: Normal heart sounds.  Pulmonary:     Effort: Pulmonary effort is normal. No respiratory distress.     Breath sounds: Normal breath sounds.  Abdominal:     General: Abdomen is flat.     Palpations: Abdomen is soft.     Tenderness: There is abdominal tenderness (rlq). There is right CVA tenderness.  Musculoskeletal:        General: No swelling. Normal range of motion.  Skin:    General: Skin is warm and dry.  Neurological:     General: No focal deficit present.     Mental Status: She is alert and oriented to person, place, and time.  Psychiatric:        Mood and Affect: Mood normal.        Behavior: Behavior normal.     Lab Results:  Results for orders placed or performed in visit on 07/18/23 (from the past 24 hour(s))  Urinalysis, Routine w reflex microscopic     Status: Abnormal   Collection Time: 07/18/23  4:03 PM  Result Value Ref Range   Specific Gravity, UA 1.005 1.005 - 1.030   pH, UA 6.0 5.0 - 7.5   Color, UA Yellow Yellow   Appearance Ur Clear Clear   Leukocytes,UA Negative Negative   Protein,UA Negative Negative/Trace   Glucose, UA Negative Negative   Ketones, UA Negative Negative   RBC, UA 1+ (A) Negative   Bilirubin, UA Negative Negative   Urobilinogen, Ur 0.2 0.2 - 1.0 mg/dL   Nitrite, UA Negative Negative   Microscopic Examination  See below:    Narrative   Performed at:  [address] - [facility] [address] [place], [place]  [id number] Lab Director: Chinita Pester MT, Phone:  [phone]  Microscopic Examination     Status: None   Collection Time: 07/18/23  4:03 PM   Urine  Result Value Ref Range   WBC, UA 0-5 0 - 5 /hpf   RBC, Urine 0-2 0 - 2 /hpf   Epithelial Cells (non renal) 0-10 0 - 10 /hpf   Bacteria, UA None seen None seen/Few   Narrative   Performed at:  [address] - [facility] [address] [place], [place]  [id number] Lab Director: Chinita Pester MT, Phone:  [phone]    BMET Recent Labs    07/16/23 1741  NA 136  K 3.5  CL 107  CO2 21*  GLUCOSE 143*  BUN 23  CREATININE 0.95  CALCIUM 8.9   PT/INR No results for input(s): "LABPROT", "INR" in the last 72 hours. ABG No results for input(s): "PHART", "HCO3" in the last 72 hours.  Invalid input(s): "PCO2", "PO2"  Studies/Results: DG Abd 1 View  Result Date: 07/18/2023 CLINICAL DATA:  Distal right ureteral stone. EXAM: ABDOMEN - 1 VIEW COMPARISON:  CT scan 07/16/2023 FINDINGS: Unremarkable bowel gas pattern. Surgical clips right upper quadrant suggest prior cholecystectomy. Multiple phleboliths project over the anatomic pelvis bilaterally. The distal right ureteral stone seen on previous CT scan seen to persist in the lower right  pelvis, but this is difficult to assess given the multiple pelvic phleboliths. IMPRESSION: Distal right ureteral stone(s) seen on previous CT scan seem to persist in the lower right pelvis, but this is difficult to assess given the multiple pelvic phleboliths. Electronically Signed   By: Kennith Center M.D.   On: 07/18/2023 15:11     Assessment/Plan: Right distal ureteral stones with intractable pain.  I was going to send her to [facility] tonight but she doesn't have transportation so I have given her Toradol and will arrange ureteroscopy with Dr. Margo Aye on call tomorrow.   I have reviewed the risks of bleeding,  infection, ureteral injury, need for a stent and secondary procedures, thrombotic events and anesthetic complications.     She is not tolerating oxycodone so I will send hydrocodone and ondansetron and she will continue the tamsulosin.   Meds ordered this encounter  Medications   ketorolac (TORADOL) injection 60 mg   ondansetron (ZOFRAN) injection 4 mg   HYDROcodone-acetaminophen (NORCO/VICODIN) 5-325 MG tablet    Sig: Take 1-2 tablets by mouth every 6 (six) hours as needed.    Dispense:  15 tablet    Refill:  0   ondansetron (ZOFRAN) 4 MG tablet    Sig: Take 1 tablet (4 mg total) by mouth every 8 (eight) hours as needed for nausea or vomiting.    Dispense:  12 tablet    Refill:  0     Orders Placed This Encounter  Procedures   Microscopic Examination   Urinalysis, Routine w reflex microscopic     Return for schedule surgery for 8/2 and then f/u with sarah in 2-3 weeks. .    CC: [facility].      Bjorn Pippin 07/19/2023

## 2023-07-18 NOTE — H&P (View-Only) (Signed)
Subjective: 1. Ureteral stone   2. Hydronephrosis with urinary obstruction due to ureteral calculus   3. Flank pain   4. Nausea and vomiting, unspecified vomiting type     Consult requested by [facility]  Olivia Manning is a 62 yo female who had the onset Tuesday of severe right flank pain with N/V.   She has some urgency with frequent small voids.  She has had no hematuria.  She had a CT in the ER that showed at least 2 distal stones with obstruction on the right with obstruction.  A KUB today still shows the stones.  She had only RBC's on the UA in the ER and normal chemistries.  She remains in pain and last took pain med without response at 11am.   ROS:  Review of Systems  Respiratory:  Positive for shortness of breath.   Neurological:  Positive for weakness.    Allergies  Allergen Reactions   Codeine Shortness Of Breath   Adhesive [Tape] Rash    Past Medical History:  Diagnosis Date   Anxiety    COPD (chronic obstructive pulmonary disease) (HCC)    H/O angioplasty    Hypertension    Panic attacks    Stroke [facility])     Past Surgical History:  Procedure Laterality Date   ABDOMINAL HYSTERECTOMY     CHOLECYSTECTOMY     KNEE ARTHROSCOPY     MIDDLE EAR SURGERY     TUBAL LIGATION      Social History   Socioeconomic History   Marital status: Divorced    Spouse name: Not on file   Number of children: Not on file   Years of education: Not on file   Highest education level: Not on file  Occupational History   Not on file  Tobacco Use   Smoking status: Former    Current packs/day: 0.00    Types: Cigarettes    Quit date: 01/29/2020    Years since quitting: 3.4   Smokeless tobacco: Former    Quit date: 02/06/2012  Substance and Sexual Activity   Alcohol use: No    Comment: occ   Drug use: Not Currently    Types: Marijuana   Sexual activity: Yes    Birth control/protection: Surgical  Other Topics Concern   Not on file  Social History Narrative   Not on file   Social  Determinants of Health   Financial Resource Strain: Low Risk  (07/31/2022)   Received from [facility], [facility]   Overall Financial Resource Strain (CARDIA)    Difficulty of Paying Living Expenses: Not hard at all  Food Insecurity: No Food Insecurity (07/31/2022)   Received from [facility], [facility]   Hunger Vital Sign    Worried About Running Out of Food in the Last Year: Never true    Ran Out of Food in the Last Year: Never true  Transportation Needs: Unmet Transportation Needs (04/17/2021)   Received from [facility], [facility]   PRAPARE - Transportation    Lack of Transportation (Medical): Yes    Lack of Transportation (Non-Medical): No  Physical Activity: Unknown (07/31/2022)   Received from [facility], [facility]   Exercise Vital Sign    Days of Exercise per Week: 0 days    Minutes of Exercise per Session: Not on file  Stress: Stress Concern Present (07/31/2022)   Received from [facility], [facility] of [facility] - Occupational Stress Questionnaire    Feeling of  Stress : Very much  Social Connections: Socially Isolated (07/31/2022)   Received from [facility], [facility]   Social Network    How would you rate your social network (family, work, friends)?: Little participation, lonely and socially isolated  Intimate Partner Violence: Not At Risk (07/08/2023)   Received from [facility]   HITS    Over the last 12 months how often did your partner physically hurt you?: 1    Over the last 12 months how often did your partner insult you or talk down to you?: 1    Over the last 12 months how often did your partner threaten you with physical harm?: 1    Over the last 12 months how often did your partner scream or curse at you?: 1    Family History  Problem Relation Age of Onset   Heart attack Father    Heart failure Father    Hypertension Father    Heart attack Brother     Anti-infectives: Anti-infectives  (From admission, onward)    None       Current Outpatient Medications  Medication Sig Dispense Refill   albuterol (PROVENTIL) (2.5 MG/3ML) 0.083% nebulizer solution Take 2.5 mg by nebulization 3 (three) times daily.     ALPRAZolam (XANAX) 1 MG tablet Take 1 mg by mouth 4 (four) times daily.     amoxicillin (AMOXIL) 500 MG capsule      amoxicillin-clavulanate (AUGMENTIN) 875-125 MG tablet Take 1 tablet by mouth 2 (two) times daily.     atorvastatin (LIPITOR) 40 MG tablet Take 40 mg by mouth daily.     budesonide-formoterol (SYMBICORT) 160-4.5 MCG/ACT inhaler SMARTSIG:2 Puff(s) By Mouth Twice Daily     cephALEXin (KEFLEX) 500 MG capsule Take 1 capsule (500 mg total) by mouth 2 (two) times daily for 10 days. 20 capsule 0   DULoxetine (CYMBALTA) 30 MG capsule Take 30 mg by mouth daily.     hydrOXYzine (ATARAX/VISTARIL) 25 MG tablet Take 25 mg by mouth 3 (three) times daily as needed.     ibuprofen (ADVIL) 800 MG tablet      ibuprofen (ADVIL,MOTRIN) 600 MG tablet Take 1 tablet (600 mg total) by mouth every 6 (six) hours as needed for pain. 20 tablet 0   ipratropium (ATROVENT) 0.02 % nebulizer solution Take 500 mcg by nebulization at bedtime.     LINZESS 290 MCG CAPS capsule Take 290 mcg by mouth daily.     meloxicam (MOBIC) 7.5 MG tablet Take 7.5 mg by mouth daily.     nitroGLYCERIN (NITROSTAT) 0.4 MG SL tablet Place 0.4 mg under the tongue every 5 (five) minutes x 3 doses as needed for chest pain.     ondansetron (ZOFRAN) 4 MG tablet Take 1 tablet (4 mg total) by mouth every 8 (eight) hours as needed for nausea or vomiting. 12 tablet 0   oxyCODONE (ROXICODONE) 5 MG immediate release tablet Take 1 tablet (5 mg total) by mouth every 4 (four) hours as needed for severe pain. 20 tablet 0   tamsulosin (FLOMAX) 0.4 MG CAPS capsule Take 1 capsule (0.4 mg total) by mouth daily after breakfast. 30 capsule 0   HYDROcodone-acetaminophen (NORCO/VICODIN) 5-325 MG tablet Take 1-2 tablets by mouth every 6  (six) hours as needed. 15 tablet 0   Current Facility-Administered Medications  Medication Dose Route Frequency Provider Last Rate Last Admin   ondansetron (ZOFRAN) injection 4 mg  4 mg Intramuscular Once          Objective:  Vital signs in last 24 hours: BP 99/67   Ht 5\' 3"  (1.6 m)   BMI 27.10 kg/m   Intake/Output from previous day: No intake/output data recorded. Intake/Output this shift: @IOTHISSHIFT @   Physical Exam Vitals reviewed.  Constitutional:      General: She is in acute distress.     Appearance: Normal appearance.  Cardiovascular:     Rate and Rhythm: Normal rate and regular rhythm.     Heart sounds: Normal heart sounds.  Pulmonary:     Effort: Pulmonary effort is normal. No respiratory distress.     Breath sounds: Normal breath sounds.  Abdominal:     General: Abdomen is flat.     Palpations: Abdomen is soft.     Tenderness: There is abdominal tenderness (rlq). There is right CVA tenderness.  Musculoskeletal:        General: No swelling. Normal range of motion.  Skin:    General: Skin is warm and dry.  Neurological:     General: No focal deficit present.     Mental Status: She is alert and oriented to person, place, and time.  Psychiatric:        Mood and Affect: Mood normal.        Behavior: Behavior normal.     Lab Results:  Results for orders placed or performed in visit on 07/18/23 (from the past 24 hour(s))  Urinalysis, Routine w reflex microscopic     Status: Abnormal   Collection Time: 07/18/23  4:03 PM  Result Value Ref Range   Specific Gravity, UA 1.005 1.005 - 1.030   pH, UA 6.0 5.0 - 7.5   Color, UA Yellow Yellow   Appearance Ur Clear Clear   Leukocytes,UA Negative Negative   Protein,UA Negative Negative/Trace   Glucose, UA Negative Negative   Ketones, UA Negative Negative   RBC, UA 1+ (A) Negative   Bilirubin, UA Negative Negative   Urobilinogen, Ur 0.2 0.2 - 1.0 mg/dL   Nitrite, UA Negative Negative   Microscopic Examination  See below:    Narrative   Performed at:  [address] - [facility] [address] [place], [place]  [id number] Lab Director: Chinita Pester MT, Phone:  [phone]  Microscopic Examination     Status: None   Collection Time: 07/18/23  4:03 PM   Urine  Result Value Ref Range   WBC, UA 0-5 0 - 5 /hpf   RBC, Urine 0-2 0 - 2 /hpf   Epithelial Cells (non renal) 0-10 0 - 10 /hpf   Bacteria, UA None seen None seen/Few   Narrative   Performed at:  [address] - [facility] [address] [place], [place]  [id number] Lab Director: Chinita Pester MT, Phone:  [phone]    BMET Recent Labs    07/16/23 1741  NA 136  K 3.5  CL 107  CO2 21*  GLUCOSE 143*  BUN 23  CREATININE 0.95  CALCIUM 8.9   PT/INR No results for input(s): "LABPROT", "INR" in the last 72 hours. ABG No results for input(s): "PHART", "HCO3" in the last 72 hours.  Invalid input(s): "PCO2", "PO2"  Studies/Results: DG Abd 1 View  Result Date: 07/18/2023 CLINICAL DATA:  Distal right ureteral stone. EXAM: ABDOMEN - 1 VIEW COMPARISON:  CT scan 07/16/2023 FINDINGS: Unremarkable bowel gas pattern. Surgical clips right upper quadrant suggest prior cholecystectomy. Multiple phleboliths project over the anatomic pelvis bilaterally. The distal right ureteral stone seen on previous CT scan seen to persist in the lower right  pelvis, but this is difficult to assess given the multiple pelvic phleboliths. IMPRESSION: Distal right ureteral stone(s) seen on previous CT scan seem to persist in the lower right pelvis, but this is difficult to assess given the multiple pelvic phleboliths. Electronically Signed   By: Kennith Center M.D.   On: 07/18/2023 15:11     Assessment/Plan: Right distal ureteral stones with intractable pain.  I was going to send her to [facility] tonight but she doesn't have transportation so I have given her Toradol and will arrange ureteroscopy with Dr. Margo Aye on call tomorrow.   I have reviewed the risks of bleeding,  infection, ureteral injury, need for a stent and secondary procedures, thrombotic events and anesthetic complications.     She is not tolerating oxycodone so I will send hydrocodone and ondansetron and she will continue the tamsulosin.   Meds ordered this encounter  Medications   ketorolac (TORADOL) injection 60 mg   ondansetron (ZOFRAN) injection 4 mg   HYDROcodone-acetaminophen (NORCO/VICODIN) 5-325 MG tablet    Sig: Take 1-2 tablets by mouth every 6 (six) hours as needed.    Dispense:  15 tablet    Refill:  0   ondansetron (ZOFRAN) 4 MG tablet    Sig: Take 1 tablet (4 mg total) by mouth every 8 (eight) hours as needed for nausea or vomiting.    Dispense:  12 tablet    Refill:  0     Orders Placed This Encounter  Procedures   Microscopic Examination   Urinalysis, Routine w reflex microscopic     Return for schedule surgery for 8/2 and then f/u with sarah in 2-3 weeks. .    CC: [facility].      Bjorn Pippin 07/19/2023

## 2023-07-19 ENCOUNTER — Other Ambulatory Visit: Payer: Self-pay

## 2023-07-19 ENCOUNTER — Ambulatory Visit (HOSPITAL_COMMUNITY)
Admission: AD | Admit: 2023-07-19 | Discharge: 2023-07-19 | Disposition: A | Discharge status: Home / Self Care | Payer: 59 | Attending: Urology | Admitting: Urology

## 2023-07-19 ENCOUNTER — Telehealth: Payer: Self-pay

## 2023-07-19 ENCOUNTER — Inpatient Hospital Stay (HOSPITAL_COMMUNITY): Payer: 59

## 2023-07-19 ENCOUNTER — Inpatient Hospital Stay (HOSPITAL_BASED_OUTPATIENT_CLINIC_OR_DEPARTMENT_OTHER): Payer: 59 | Admitting: Certified Registered"

## 2023-07-19 ENCOUNTER — Encounter (HOSPITAL_COMMUNITY): Payer: Self-pay | Admitting: Urology

## 2023-07-19 ENCOUNTER — Inpatient Hospital Stay (HOSPITAL_COMMUNITY): Payer: 59 | Admitting: Certified Registered"

## 2023-07-19 ENCOUNTER — Encounter (HOSPITAL_COMMUNITY)
Admission: AD | Disposition: A | Discharge status: Home / Self Care | Payer: Self-pay | Source: Home / Self Care | Attending: Urology

## 2023-07-19 DIAGNOSIS — F419 Anxiety disorder, unspecified: Secondary | ICD-10-CM | POA: Diagnosis not present

## 2023-07-19 DIAGNOSIS — I10 Essential (primary) hypertension: Secondary | ICD-10-CM | POA: Insufficient documentation

## 2023-07-19 DIAGNOSIS — Z79899 Other long term (current) drug therapy: Secondary | ICD-10-CM | POA: Diagnosis not present

## 2023-07-19 DIAGNOSIS — Z87891 Personal history of nicotine dependence: Secondary | ICD-10-CM | POA: Diagnosis not present

## 2023-07-19 DIAGNOSIS — J449 Chronic obstructive pulmonary disease, unspecified: Secondary | ICD-10-CM

## 2023-07-19 DIAGNOSIS — N132 Hydronephrosis with renal and ureteral calculous obstruction: Secondary | ICD-10-CM | POA: Insufficient documentation

## 2023-07-19 DIAGNOSIS — Z5982 Transportation insecurity: Secondary | ICD-10-CM | POA: Diagnosis not present

## 2023-07-19 DIAGNOSIS — N201 Calculus of ureter: Secondary | ICD-10-CM

## 2023-07-19 HISTORY — PX: CYSTOSCOPY WITH RETROGRADE PYELOGRAM, URETEROSCOPY AND STENT PLACEMENT: SHX5789

## 2023-07-19 SURGERY — CYSTOURETEROSCOPY, WITH RETROGRADE PYELOGRAM AND STENT INSERTION
Anesthesia: General | Laterality: Right

## 2023-07-19 SURGERY — CYSTOURETEROSCOPY, WITH RETROGRADE PYELOGRAM AND STENT INSERTION
Anesthesia: General | Site: Ureter | Laterality: Right

## 2023-07-19 MED ORDER — PHENYLEPHRINE 80 MCG/ML (10ML) SYRINGE FOR IV PUSH (FOR BLOOD PRESSURE SUPPORT)
PREFILLED_SYRINGE | INTRAVENOUS | Status: DC | PRN
  Administered 2023-07-19: 160 ug via INTRAVENOUS

## 2023-07-19 MED ORDER — PHENYLEPHRINE 80 MCG/ML (10ML) SYRINGE FOR IV PUSH (FOR BLOOD PRESSURE SUPPORT)
PREFILLED_SYRINGE | INTRAVENOUS | Status: AC
  Filled 2023-07-19: qty 10

## 2023-07-19 MED ORDER — OXYCODONE HCL 5 MG PO TABS: 5.0000 mg | ORAL_TABLET | Freq: Once | ORAL | Status: DC | PRN

## 2023-07-19 MED ORDER — PROPOFOL 10 MG/ML IV BOLUS
INTRAVENOUS | Status: DC | PRN
Start: 2023-07-19 — End: 2023-07-19
  Administered 2023-07-19: 160 mg via INTRAVENOUS

## 2023-07-19 MED ORDER — PROMETHAZINE HCL 25 MG/ML IJ SOLN: 6.2500 mg | INTRAMUSCULAR | Status: DC | PRN

## 2023-07-19 MED ORDER — FENTANYL CITRATE (PF) 100 MCG/2ML IJ SOLN
INTRAMUSCULAR | Status: DC | PRN
  Administered 2023-07-19 (×2): 50 ug via INTRAVENOUS

## 2023-07-19 MED ORDER — PROPOFOL 10 MG/ML IV BOLUS
INTRAVENOUS | Status: AC
  Filled 2023-07-19: qty 20

## 2023-07-19 MED ORDER — SODIUM CHLORIDE 0.9 % IR SOLN
Status: DC | PRN
  Administered 2023-07-19: 3000 mL

## 2023-07-19 MED ORDER — OXYCODONE HCL 5 MG/5ML PO SOLN: 5.0000 mg | Freq: Once | ORAL | Status: DC | PRN

## 2023-07-19 MED ORDER — LIDOCAINE 2% (20 MG/ML) 5 ML SYRINGE
INTRAMUSCULAR | Status: DC | PRN
  Administered 2023-07-19: 80 mg via INTRAVENOUS

## 2023-07-19 MED ORDER — HYDROMORPHONE HCL 1 MG/ML IJ SOLN: 0.2500 mg | INTRAMUSCULAR | Status: DC | PRN

## 2023-07-19 MED ORDER — LACTATED RINGERS IV SOLN: INTRAVENOUS | Status: DC

## 2023-07-19 MED ORDER — MIDAZOLAM HCL 2 MG/2ML IJ SOLN
INTRAMUSCULAR | Status: AC
  Filled 2023-07-19: qty 2

## 2023-07-19 MED ORDER — EPHEDRINE SULFATE-NACL 50-0.9 MG/10ML-% IV SOSY
PREFILLED_SYRINGE | INTRAVENOUS | Status: DC | PRN
  Administered 2023-07-19 (×2): 10 mg via INTRAVENOUS
  Administered 2023-07-19: 5 mg via INTRAVENOUS

## 2023-07-19 MED ORDER — FENTANYL CITRATE (PF) 100 MCG/2ML IJ SOLN
INTRAMUSCULAR | Status: AC
  Filled 2023-07-19: qty 2

## 2023-07-19 MED ORDER — DEXAMETHASONE SODIUM PHOSPHATE 10 MG/ML IJ SOLN
INTRAMUSCULAR | Status: DC | PRN
  Administered 2023-07-19: 10 mg via INTRAVENOUS

## 2023-07-19 MED ORDER — SODIUM CHLORIDE 0.9 % IV SOLN
1.0000 g | INTRAVENOUS | Status: AC
  Administered 2023-07-19: 2 g via INTRAVENOUS
  Filled 2023-07-19: qty 10

## 2023-07-19 MED ORDER — MIDAZOLAM HCL 5 MG/5ML IJ SOLN
INTRAMUSCULAR | Status: DC | PRN
  Administered 2023-07-19: 2 mg via INTRAVENOUS

## 2023-07-19 MED ORDER — AMISULPRIDE (ANTIEMETIC) 5 MG/2ML IV SOLN: 10.0000 mg | Freq: Once | INTRAVENOUS | Status: DC | PRN

## 2023-07-19 MED ORDER — DEXAMETHASONE SODIUM PHOSPHATE 10 MG/ML IJ SOLN
INTRAMUSCULAR | Status: AC
  Filled 2023-07-19: qty 1

## 2023-07-19 MED ORDER — DEXMEDETOMIDINE HCL IN NACL 80 MCG/20ML IV SOLN
INTRAVENOUS | Status: DC | PRN
  Administered 2023-07-19 (×3): 4 ug via INTRAVENOUS

## 2023-07-19 MED ORDER — IOHEXOL 300 MG/ML  SOLN
INTRAMUSCULAR | Status: DC | PRN
  Administered 2023-07-19: 11 mL

## 2023-07-19 MED ORDER — LIDOCAINE HCL URETHRAL/MUCOSAL 2 % EX GEL
CUTANEOUS | Status: AC
  Filled 2023-07-19: qty 30

## 2023-07-19 MED ORDER — ONDANSETRON HCL 4 MG/2ML IJ SOLN
INTRAMUSCULAR | Status: DC | PRN
Start: 2023-07-19 — End: 2023-07-19
  Administered 2023-07-19: 4 mg via INTRAVENOUS

## 2023-07-19 MED ORDER — KETOROLAC TROMETHAMINE 30 MG/ML IJ SOLN
INTRAMUSCULAR | Status: DC | PRN
  Administered 2023-07-19: 30 mg via INTRAVENOUS

## 2023-07-19 MED ORDER — CHLORHEXIDINE GLUCONATE 0.12 % MT SOLN: 15.0000 mL | Freq: Once | OROMUCOSAL | Status: DC

## 2023-07-19 MED ORDER — EPHEDRINE 5 MG/ML INJ
INTRAVENOUS | Status: AC
  Filled 2023-07-19: qty 5

## 2023-07-19 MED ORDER — ONDANSETRON HCL 4 MG/2ML IJ SOLN
INTRAMUSCULAR | Status: AC
  Filled 2023-07-19: qty 2

## 2023-07-19 MED ORDER — KETOROLAC TROMETHAMINE 30 MG/ML IJ SOLN
INTRAMUSCULAR | Status: AC
  Filled 2023-07-19: qty 1

## 2023-07-19 MED ORDER — LIDOCAINE HCL URETHRAL/MUCOSAL 2 % EX GEL
CUTANEOUS | Status: DC | PRN
  Administered 2023-07-19: 1 via URETHRAL

## 2023-07-19 SURGICAL SUPPLY — 23 items
BAG URO CATCHER STRL LF (MISCELLANEOUS) ×1 IMPLANT
BASKET ZERO TIP NITINOL 2.4FR (BASKET) IMPLANT
BSKT STON RTRVL ZERO TP 2.4FR (BASKET) ×1
CATH URETERAL DUAL LUMEN 10F (MISCELLANEOUS) IMPLANT
CATH URETL OPEN END 6FR 70 (CATHETERS) IMPLANT
CLOTH BEACON ORANGE TIMEOUT ST (SAFETY) ×1 IMPLANT
FIBER LASER MOSES 200 DFL (Laser) IMPLANT
FIBER LASER MOSES 365 DFL (Laser) IMPLANT
GLOVE BIO SURGEON STRL SZ8.5 (GLOVE) ×1 IMPLANT
GOWN STRL REUS W/ TWL XL LVL3 (GOWN DISPOSABLE) ×1 IMPLANT
GOWN STRL REUS W/TWL XL LVL3 (GOWN DISPOSABLE) ×1
GUIDEWIRE AMPLATZ STIFF 0.35 (WIRE) IMPLANT
GUIDEWIRE STR DUAL SENSOR (WIRE) ×1 IMPLANT
GUIDEWIRE SUPER STIFF (WIRE) IMPLANT
GUIDEWIRE ZIPWRE .038 STRAIGHT (WIRE) IMPLANT
KIT TURNOVER KIT A (KITS) IMPLANT
MANIFOLD NEPTUNE II (INSTRUMENTS) ×1 IMPLANT
PACK CYSTO (CUSTOM PROCEDURE TRAY) ×1 IMPLANT
SHEATH NAVIGATOR HD 11/13X28 (SHEATH) IMPLANT
SHEATH NAVIGATOR HD 11/13X36 (SHEATH) IMPLANT
STENT URET 6FRX22 CONTOUR (STENTS) IMPLANT
TUBING CONNECTING 10 (TUBING) ×1 IMPLANT
TUBING UROLOGY SET (TUBING) ×1 IMPLANT

## 2023-07-19 NOTE — Progress Notes (Signed)
SDW CALL  Patient was given pre-op instructions over the phone. The opportunity was given for the patient to ask questions. No further questions asked. Patient verbalized understanding of instructions given.   PCP - [facility] Cardiologist - Dr Judeth Horn   Chest x-ray - 07/08/23 - [facility] - mage obtained in mid expiration. Cardiac size stable. Atelectasis left upper lung. No acute infiltrate or effusion. Bony thorax intact.   EKG - DOS  Stress Test - 06/15/2022 - [facility]  ECHO - 04/05/2020- [facility]  Cardiac Cath - 06/25/2022 - patient reported she has stent placed    Blood Thinner Instructions: none Aspirin Instructions: none   ERAS Protcol - NO PRE-SURGERY Ensure or G2- none  COVID TEST- not needed   Anesthesia review: yes, Spoke to Dr Hyacinth Meeker  Patient denies shortness of breath, fever, cough and chest pain over the phone call   All instructions explained to the patient, with a verbal understanding of the material. Patient agrees to go over the instructions while at home for a better understanding.

## 2023-07-19 NOTE — Anesthesia Procedure Notes (Signed)
Procedure Name: LMA Insertion Date/Time: 07/19/2023 12:37 PM  Performed by: ,  D, CRNAPre-anesthesia Checklist: Patient identified, Emergency Drugs available, Suction available and Patient being monitored Patient Re-evaluated:Patient Re-evaluated prior to induction Oxygen Delivery Method: Circle system utilized Preoxygenation: Pre-oxygenation with 100% oxygen Induction Type: IV induction Ventilation: Mask ventilation without difficulty LMA: LMA inserted LMA Size: 4.0 Tube type: Oral Number of attempts: 1 Placement Confirmation: positive ETCO2 and breath sounds checked- equal and bilateral Tube secured with: Tape Dental Injury: Teeth and Oropharynx as per pre-operative assessment

## 2023-07-19 NOTE — Interval H&P Note (Signed)
History and Physical Interval Note:  07/19/2023 12:26 PM  Olivia Manning  has presented today for surgery, with the diagnosis of right ureteral stone.  The various methods of treatment have been discussed with the patient and family. After consideration of risks, benefits and other options for treatment, the patient has consented to  Procedure(s): CYSTOSCOPY WITH RETROGRADE PYELOGRAM, URETEROSCOPY AND STENT PLACEMENT (Right) as a surgical intervention.  The patient's history has been reviewed, patient examined, no change in status, stable for surgery.  I have reviewed the patient's chart and labs.  Questions were answered to the patient's satisfaction.     [name]

## 2023-07-19 NOTE — Telephone Encounter (Signed)
Patient's authorization for cystoscopy procedure scheduled on 08/02 changed from outpatient to inpatient.  Reference number [id number].

## 2023-07-19 NOTE — Anesthesia Preprocedure Evaluation (Addendum)
Anesthesia Evaluation  Patient identified by MRN, date of birth, ID band Patient awake    Reviewed: Allergy & Precautions, H&P , NPO status , Patient's Chart, lab work & pertinent test results  Airway Mallampati: II  TM Distance: >3 FB Neck ROM: Full    Dental  (+) Partial Lower   Pulmonary COPD, former smoker   Pulmonary exam normal breath sounds clear to auscultation       Cardiovascular hypertension, Pt. on medications Normal cardiovascular exam Rhythm:Regular Rate:Normal     Neuro/Psych   Anxiety     CVA  negative psych ROS   GI/Hepatic negative GI ROS, Neg liver ROS,,,  Endo/Other  negative endocrine ROS    Renal/GU negative Renal ROS  negative genitourinary   Musculoskeletal negative musculoskeletal ROS (+)    Abdominal   Peds negative pediatric ROS (+)  Hematology negative hematology ROS (+)   Anesthesia Other Findings   Reproductive/Obstetrics negative OB ROS                             Anesthesia Physical Anesthesia Plan  ASA: 3  Anesthesia Plan: General   Post-op Pain Management:    Induction: Intravenous  PONV Risk Score and Plan: 3 and Ondansetron, Dexamethasone, Midazolam and Treatment may vary due to age or medical condition  Airway Management Planned: LMA  Additional Equipment:   Intra-op Plan:   Post-operative Plan: Extubation in OR  Informed Consent: I have reviewed the patients History and Physical, chart, labs and discussed the procedure including the risks, benefits and alternatives for the proposed anesthesia with the patient or authorized representative who has indicated his/her understanding and acceptance.     Dental advisory given  Plan Discussed with: CRNA  Anesthesia Plan Comments:        Anesthesia Quick Evaluation

## 2023-07-19 NOTE — Transfer of Care (Signed)
Immediate Anesthesia Transfer of Care Note  Patient: Olivia Manning  Procedure(s) Performed: CYSTOSCOPY WITH RETROGRADE PYELOGRAM; RIGHT URETEROSCOPY; HOLMIUM LASER LITHOTRIPSY; BASKET STONE EXTRACTION; RIGHT URETERAL  STENT PLACEMENT (Right: Ureter)  Patient Location: PACU  Anesthesia Type:General  Level of Consciousness: awake, alert , and oriented  Airway & Oxygen Therapy: Patient Spontanous Breathing and Patient connected to face mask oxygen  Post-op Assessment: Report given to RN and Post -op Vital signs reviewed and stable  Post vital signs: Reviewed and stable  Last Vitals:  Vitals Value Taken Time  BP 108/62 07/19/23 1338  Temp    Pulse 77 07/19/23 1339  Resp 16 07/19/23 1339  SpO2 100 % 07/19/23 1339  Vitals shown include unfiled device data.  Last Pain:  Vitals:   07/19/23 1054  TempSrc:   PainSc: 10-Worst pain ever         Complications: No notable events documented.

## 2023-07-19 NOTE — Op Note (Signed)
OPERATIVE NOTE   Patient Name: Olivia Manning  MRN: [id number]   Date of Procedure: 07/19/2023   Preoperative diagnosis:  Right distal ureteral calculi  Postoperative diagnosis:  same  Procedure:  Cystoscopy, urethral dilation, right retrograde pyelogram, right ureteroscopy with laser lithotripsy-stone extraction and placement of right double-J ureteral stent  Attending: Concepcion Living MD  Anesthesia: General with LMA  Estimated blood loss: Minimal  Fluids: Per anesthesia record  Drains: 6 x 22 cm double-J stent with tether removed  Specimens: Stone obtained for analysis  Antibiotics: Rocephin 1 g IV  Findings: 2 back-to-back 5 to 6 mm right distal ureteral stones with the leading stone quite impacted with a lot of urothelial inflammation and edema.  Indications:  Right distal ureteral stones  Description of Procedure:  Patient was brought to the operating room where she was properly identified and subsequently underwent satisfactory induction of general anesthesia with LMA in the supine position.  She was then transitioned to the modified dorsolithotomy position and her external genitalia were prepped and draped in usual sterile fashion.  Preprocedural timeout was performed.  Initially I tried to place the 22 [place] panendoscope but the patient had a tight meatal stenosis.  The urethra was subsequently dilated to 8 [place] using female sounds without difficulty.  The panendoscope was subsequently inserted and the bladder was inspected.  The ureteral openings appeared normal.  There were no mucosal abnormalities. Right retrograde ureteropyelogram was subsequently performed which showed a filling defect in the distal ureter proximal to the bladder consistent with known stones.  There was proximal dilation as well.  At this time I attempted to place a sensor safety wire but was unable to pass it after multiple attempts.  I then placed an endhole catheter up to the level  of the stones and was able to place a zip wire up to the renal collecting system.  I then tried to exchange it with an open-ended catheter but was unable to place the catheter beyond the stones.  With the zip wire in place as a safety wire I then deployed the semirigid ureteroscope.  I was able to access the ureter and reached the distal stone which was quite impacted with a lot of surrounding ureteral edema and inflammation.  I subsequently deployed the 360 m Moses laser fiber and was able to break up the leading stone into small little pieces.  The larger proximal stone was then engaged as well and with continued laser energy I was able to fragmented into multiple small pieces.  1 approximately 2 to 3 mm fragment was extracted with a basket and sent for stone analysis.  After all of the fibroids were removed the operative site was carefully examined there is noted to be a small lateral perforation at the level of the impaction with a small stone fragment.  I was unable to tease it out from the ureteral wall.  At this time I elected to terminate the procedure and place a ureteral stent.  Under endoscopic and fluoroscopic control a 6 x 22 cm double-J stent was placed with good position obtained proximally and distally.  The bladder was subsequently drained and the procedure terminated 2% lidocaine jelly was inserted per urethra.  Patient was subsequently extubated and taken to the recovery room in satisfactory condition.    Complications: None  Condition: Stable, extubated, transferred to PACU  Plan: FU 2 weeks for cysto and stent removal

## 2023-07-22 ENCOUNTER — Telehealth: Payer: Self-pay | Admitting: Urology

## 2023-07-22 ENCOUNTER — Encounter (HOSPITAL_COMMUNITY): Payer: Self-pay | Admitting: Urology

## 2023-07-22 NOTE — Telephone Encounter (Signed)
Left Message - Dr Margo Aye - Schedule for cysto and stent removal 2 weeks post op thakns

## 2023-07-22 NOTE — Anesthesia Postprocedure Evaluation (Signed)
Anesthesia Post Note  Patient: Olivia Manning  Procedure(s) Performed: CYSTOSCOPY WITH RETROGRADE PYELOGRAM; RIGHT URETEROSCOPY; HOLMIUM LASER LITHOTRIPSY; BASKET STONE EXTRACTION; RIGHT URETERAL  STENT PLACEMENT (Right: Ureter)     Patient location during evaluation: PACU Anesthesia Type: General Level of consciousness: awake and alert Pain management: pain level controlled Vital Signs Assessment: post-procedure vital signs reviewed and stable Respiratory status: spontaneous breathing, nonlabored ventilation and respiratory function stable Cardiovascular status: blood pressure returned to baseline and stable Postop Assessment: no apparent nausea or vomiting Anesthetic complications: no   No notable events documented.  Last Vitals:  Vitals:   07/19/23 1400 07/19/23 1415  BP: 114/77 115/72  Pulse: 76 73  Resp: 14 12  Temp:  37.1 C  SpO2: 93% 93%    Last Pain:  Vitals:   07/19/23 1415  TempSrc:   PainSc: 0-No pain                 Lowella Curb

## 2023-07-25 ENCOUNTER — Other Ambulatory Visit: Payer: Self-pay

## 2023-07-25 DIAGNOSIS — N201 Calculus of ureter: Secondary | ICD-10-CM

## 2023-08-01 ENCOUNTER — Ambulatory Visit (INDEPENDENT_AMBULATORY_CARE_PROVIDER_SITE_OTHER): Payer: 59 | Admitting: Urology

## 2023-08-01 VITALS — BP 149/68 | HR 60 | Ht 63.0 in | Wt 136.0 lb

## 2023-08-01 DIAGNOSIS — Z2989 Encounter for other specified prophylactic measures: Secondary | ICD-10-CM

## 2023-08-01 DIAGNOSIS — N201 Calculus of ureter: Secondary | ICD-10-CM

## 2023-08-01 LAB — MICROSCOPIC EXAMINATION: RBC, Urine: >30 /hpf — AB (ref 0–2)

## 2023-08-01 LAB — URINALYSIS, ROUTINE W REFLEX MICROSCOPIC
Bilirubin, UA: NEGATIVE
Glucose, UA: NEGATIVE
Ketones, UA: NEGATIVE
Nitrite, UA: NEGATIVE
Specific Gravity, UA: 1.02 (ref 1.005–1.030)
Urobilinogen, Ur: 0.2 mg/dL (ref 0.2–1.0)
pH, UA: 6 (ref 5.0–7.5)

## 2023-08-01 MED ORDER — CEFTRIAXONE SODIUM 1 G IJ SOLR
1.0000 g | Freq: Once | INTRAMUSCULAR | Status: AC
Start: 2023-08-01 — End: 2023-08-01
  Administered 2023-08-01: 1 g via INTRAMUSCULAR

## 2023-08-01 MED ORDER — CEFDINIR 300 MG PO CAPS
300.0000 mg | ORAL_CAPSULE | Freq: Two times a day (BID) | ORAL | 0 refills | Status: AC
Start: 2023-08-01 — End: ?

## 2023-08-01 NOTE — Progress Notes (Signed)
Assessment: 1. Right ureteral stone     Plan: Right double-J stent removed without difficulty today. Will send urine for culture Treat empirically with Rocephin 1 g IM followed by Omnicef 300 twice daily for 5 days.  Patient will follow-up in 6 weeks with renal ultrasound prior to visit. Will need to review stone prevention measures in detail at that time.  Chief Complaint: Here for stent removal  HPI: Olivia Manning is a 62 y.o. female who presents for continued evaluation of nephrolithiasis.. Patient is status post right ureteroscopy with laser/stone extraction and placement of right double-J stent on 07/19/2023.  She was found to have 2 distal stones each approximately 6 to 7 mm back to back in the right distal ureter with the leading stone quite impacted with a lot of urothelial inflammation and edema.   CT only one other small right lower pole stone. All significant stone fragments were removed.  Patient presents here today for stent removal.  Portions of the above documentation were copied from a prior visit for review purposes only.  Allergies: Allergies  Allergen Reactions   Codeine Shortness Of Breath   Adhesive [Tape] Rash    PMH: Past Medical History:  Diagnosis Date   Anxiety    COPD (chronic obstructive pulmonary disease) (HCC)    H/O angioplasty    Hypertension    Panic attacks    Stroke (HCC)     PSH: Past Surgical History:  Procedure Laterality Date   ABDOMINAL HYSTERECTOMY     CHOLECYSTECTOMY     CYSTOSCOPY WITH RETROGRADE PYELOGRAM, URETEROSCOPY AND STENT PLACEMENT Right 07/19/2023   Procedure: CYSTOSCOPY WITH RETROGRADE PYELOGRAM; RIGHT URETEROSCOPY; HOLMIUM LASER LITHOTRIPSY; BASKET STONE EXTRACTION; RIGHT URETERAL  STENT PLACEMENT;  Surgeon: [name], MD;  Location: [facility];  Service: Urology;  Laterality: Right;   KNEE ARTHROSCOPY     MIDDLE EAR SURGERY     TUBAL LIGATION      SH: Social History   Tobacco Use   Smoking status:  Former    Current packs/day: 0.00    Types: Cigarettes    Quit date: 01/29/2020    Years since quitting: 3.5   Smokeless tobacco: Former    Quit date: 02/06/2012  Vaping Use   Vaping status: Never Used  Substance Use Topics   Alcohol use: No    Comment: occ   Drug use: Not Currently    Types: Marijuana    ROS: Constitutional:  Negative for fever, chills, weight loss CV: Negative for chest pain, previous MI, hypertension Respiratory:  Negative for shortness of breath, wheezing, sleep apnea, frequent cough GI:  Negative for nausea, vomiting, bloody stool, GERD  PE: Vitals:   08/01/23 0919  BP: (!) 149/68  Pulse: 60    GENERAL APPEARANCE:  Well appearing, well developed, well nourished, NAD    Results: Urinalysis is remarkable for significant microscopic hematuria pyuria as well as a few bacteria.   Procedure: Cystoscopy with stent removal  Indication: Ureteral calculi status post ureteroscopy and stent placement  Description of procedure: Patient was brought to the procedure room where she was correctly identified.  The procedure was again reviewed with the patient and informed consent was obtained.  Patient was then positioned in the modified dorsolithotomy position.  She was prepped and draped in usual fashion.  Preprocedural timeout was performed.  Flexible cystoscopy was subsequently performed.  The double-J stent was seen coming from the right ureteral orifice and was grasped with a flexible graspers and removed  intact without difficulty.  The procedure was well-tolerated.  No complications.

## 2023-08-01 NOTE — Addendum Note (Signed)
Addended by: Carolin Coy on: 08/01/2023 09:50 AM   Modules accepted: Orders

## 2023-08-01 NOTE — Progress Notes (Signed)
IM Injection  Patient is present today for an IM Injection for treatment of stent removal Drug: Ceftriaxone  Dose:1 gram Location:left glute Lot: [id number] Exp:02/2025 Patient tolerated well, no complications were noted  Performed by:  N., CMA(AAMA)

## 2023-08-13 ENCOUNTER — Ambulatory Visit: Payer: Commercial Managed Care - HMO | Admitting: Urology

## 2023-09-02 ENCOUNTER — Ambulatory Visit (HOSPITAL_COMMUNITY): Admission: RE | Admit: 2023-09-02 | Payer: 59 | Source: Ambulatory Visit

## 2023-09-09 ENCOUNTER — Ambulatory Visit (HOSPITAL_COMMUNITY): Admission: RE | Admit: 2023-09-09 | Payer: 59 | Source: Ambulatory Visit

## 2023-09-17 ENCOUNTER — Ambulatory Visit: Payer: 59 | Admitting: Urology

## 2024-01-21 ENCOUNTER — Ambulatory Visit: Payer: 59 | Admitting: Family Medicine

## 2024-01-28 DIAGNOSIS — I10 Essential (primary) hypertension: Secondary | ICD-10-CM | POA: Diagnosis not present

## 2024-01-28 DIAGNOSIS — I2510 Atherosclerotic heart disease of native coronary artery without angina pectoris: Secondary | ICD-10-CM | POA: Diagnosis not present

## 2024-01-28 DIAGNOSIS — E785 Hyperlipidemia, unspecified: Secondary | ICD-10-CM | POA: Diagnosis not present

## 2024-01-28 DIAGNOSIS — R079 Chest pain, unspecified: Secondary | ICD-10-CM | POA: Diagnosis not present

## 2024-01-28 DIAGNOSIS — J449 Chronic obstructive pulmonary disease, unspecified: Secondary | ICD-10-CM | POA: Diagnosis not present

## 2024-01-28 DIAGNOSIS — R55 Syncope and collapse: Secondary | ICD-10-CM | POA: Diagnosis not present

## 2024-01-28 DIAGNOSIS — Z8673 Personal history of transient ischemic attack (TIA), and cerebral infarction without residual deficits: Secondary | ICD-10-CM | POA: Diagnosis not present

## 2024-02-07 DIAGNOSIS — R079 Chest pain, unspecified: Secondary | ICD-10-CM | POA: Diagnosis not present

## 2024-02-07 DIAGNOSIS — I2510 Atherosclerotic heart disease of native coronary artery without angina pectoris: Secondary | ICD-10-CM | POA: Diagnosis not present

## 2024-02-07 DIAGNOSIS — R55 Syncope and collapse: Secondary | ICD-10-CM | POA: Diagnosis not present

## 2024-02-07 DIAGNOSIS — Z8673 Personal history of transient ischemic attack (TIA), and cerebral infarction without residual deficits: Secondary | ICD-10-CM | POA: Diagnosis not present

## 2024-02-07 DIAGNOSIS — I10 Essential (primary) hypertension: Secondary | ICD-10-CM | POA: Diagnosis not present

## 2024-02-18 DIAGNOSIS — R079 Chest pain, unspecified: Secondary | ICD-10-CM | POA: Diagnosis not present

## 2024-02-18 DIAGNOSIS — R55 Syncope and collapse: Secondary | ICD-10-CM | POA: Diagnosis not present

## 2024-02-18 DIAGNOSIS — I2510 Atherosclerotic heart disease of native coronary artery without angina pectoris: Secondary | ICD-10-CM | POA: Diagnosis not present

## 2024-02-24 DIAGNOSIS — Z87891 Personal history of nicotine dependence: Secondary | ICD-10-CM | POA: Diagnosis not present

## 2024-02-24 DIAGNOSIS — Z7982 Long term (current) use of aspirin: Secondary | ICD-10-CM | POA: Diagnosis not present

## 2024-02-24 DIAGNOSIS — J439 Emphysema, unspecified: Secondary | ICD-10-CM | POA: Diagnosis not present

## 2024-02-24 DIAGNOSIS — I10 Essential (primary) hypertension: Secondary | ICD-10-CM | POA: Diagnosis not present

## 2024-02-24 DIAGNOSIS — E785 Hyperlipidemia, unspecified: Secondary | ICD-10-CM | POA: Diagnosis not present

## 2024-02-24 DIAGNOSIS — I2584 Coronary atherosclerosis due to calcified coronary lesion: Secondary | ICD-10-CM | POA: Diagnosis not present

## 2024-02-24 DIAGNOSIS — Z720 Tobacco use: Secondary | ICD-10-CM | POA: Diagnosis not present

## 2024-02-24 DIAGNOSIS — Z789 Other specified health status: Secondary | ICD-10-CM | POA: Diagnosis not present

## 2024-02-24 DIAGNOSIS — Z7951 Long term (current) use of inhaled steroids: Secondary | ICD-10-CM | POA: Diagnosis not present

## 2024-02-24 DIAGNOSIS — J449 Chronic obstructive pulmonary disease, unspecified: Secondary | ICD-10-CM | POA: Diagnosis not present

## 2024-02-24 DIAGNOSIS — Z8616 Personal history of COVID-19: Secondary | ICD-10-CM | POA: Diagnosis not present

## 2024-02-24 DIAGNOSIS — E782 Mixed hyperlipidemia: Secondary | ICD-10-CM | POA: Diagnosis not present

## 2024-02-24 DIAGNOSIS — Z8673 Personal history of transient ischemic attack (TIA), and cerebral infarction without residual deficits: Secondary | ICD-10-CM | POA: Diagnosis not present

## 2024-02-24 DIAGNOSIS — Z79899 Other long term (current) drug therapy: Secondary | ICD-10-CM | POA: Diagnosis not present

## 2024-02-24 DIAGNOSIS — I2510 Atherosclerotic heart disease of native coronary artery without angina pectoris: Secondary | ICD-10-CM | POA: Diagnosis not present

## 2024-03-11 DIAGNOSIS — I10 Essential (primary) hypertension: Secondary | ICD-10-CM | POA: Diagnosis not present

## 2024-03-11 DIAGNOSIS — J449 Chronic obstructive pulmonary disease, unspecified: Secondary | ICD-10-CM | POA: Diagnosis not present

## 2024-03-11 DIAGNOSIS — H6502 Acute serous otitis media, left ear: Secondary | ICD-10-CM | POA: Diagnosis not present

## 2024-03-11 DIAGNOSIS — E785 Hyperlipidemia, unspecified: Secondary | ICD-10-CM | POA: Diagnosis not present

## 2024-03-11 DIAGNOSIS — F17219 Nicotine dependence, cigarettes, with unspecified nicotine-induced disorders: Secondary | ICD-10-CM | POA: Diagnosis not present

## 2024-03-11 DIAGNOSIS — K219 Gastro-esophageal reflux disease without esophagitis: Secondary | ICD-10-CM | POA: Diagnosis not present

## 2024-03-11 DIAGNOSIS — Z720 Tobacco use: Secondary | ICD-10-CM | POA: Diagnosis not present

## 2024-03-11 DIAGNOSIS — Z1231 Encounter for screening mammogram for malignant neoplasm of breast: Secondary | ICD-10-CM | POA: Diagnosis not present

## 2024-03-11 DIAGNOSIS — Z131 Encounter for screening for diabetes mellitus: Secondary | ICD-10-CM | POA: Diagnosis not present

## 2024-03-11 DIAGNOSIS — M810 Age-related osteoporosis without current pathological fracture: Secondary | ICD-10-CM | POA: Diagnosis not present

## 2024-03-11 DIAGNOSIS — K5900 Constipation, unspecified: Secondary | ICD-10-CM | POA: Diagnosis not present

## 2024-03-11 DIAGNOSIS — M5441 Lumbago with sciatica, right side: Secondary | ICD-10-CM | POA: Diagnosis not present

## 2024-03-11 DIAGNOSIS — G8929 Other chronic pain: Secondary | ICD-10-CM | POA: Diagnosis not present

## 2024-03-11 DIAGNOSIS — Z0000 Encounter for general adult medical examination without abnormal findings: Secondary | ICD-10-CM | POA: Diagnosis not present

## 2024-03-11 DIAGNOSIS — M5442 Lumbago with sciatica, left side: Secondary | ICD-10-CM | POA: Diagnosis not present

## 2024-04-13 DIAGNOSIS — M85852 Other specified disorders of bone density and structure, left thigh: Secondary | ICD-10-CM | POA: Diagnosis not present

## 2024-04-13 DIAGNOSIS — M810 Age-related osteoporosis without current pathological fracture: Secondary | ICD-10-CM | POA: Diagnosis not present

## 2024-04-13 DIAGNOSIS — F17219 Nicotine dependence, cigarettes, with unspecified nicotine-induced disorders: Secondary | ICD-10-CM | POA: Diagnosis not present

## 2024-07-09 DIAGNOSIS — F17219 Nicotine dependence, cigarettes, with unspecified nicotine-induced disorders: Secondary | ICD-10-CM | POA: Diagnosis not present

## 2024-07-09 DIAGNOSIS — F17210 Nicotine dependence, cigarettes, uncomplicated: Secondary | ICD-10-CM | POA: Diagnosis not present

## 2024-09-02 DIAGNOSIS — M25561 Pain in right knee: Secondary | ICD-10-CM | POA: Diagnosis not present

## 2024-09-02 DIAGNOSIS — M25562 Pain in left knee: Secondary | ICD-10-CM | POA: Diagnosis not present

## 2024-09-02 DIAGNOSIS — M7052 Other bursitis of knee, left knee: Secondary | ICD-10-CM | POA: Diagnosis not present

## 2024-09-02 DIAGNOSIS — M1711 Unilateral primary osteoarthritis, right knee: Secondary | ICD-10-CM | POA: Diagnosis not present
